# Patient Record
Sex: Female | Born: 1980 | Race: White | Hispanic: No | Marital: Married
Health system: Midwestern US, Community
[De-identification: ages and names within clinical notes are randomized; demographics above are authoritative.]

## PROBLEM LIST (undated history)

## (undated) DIAGNOSIS — F419 Anxiety disorder, unspecified: Secondary | ICD-10-CM

## (undated) DIAGNOSIS — N946 Dysmenorrhea, unspecified: Secondary | ICD-10-CM

## (undated) DIAGNOSIS — T7840XA Allergy, unspecified, initial encounter: Secondary | ICD-10-CM

## (undated) DIAGNOSIS — D509 Iron deficiency anemia, unspecified: Secondary | ICD-10-CM

## (undated) DIAGNOSIS — Z0184 Encounter for antibody response examination: Secondary | ICD-10-CM

## (undated) DIAGNOSIS — R768 Other specified abnormal immunological findings in serum: Secondary | ICD-10-CM

## (undated) DIAGNOSIS — R0602 Shortness of breath: Principal | ICD-10-CM

## (undated) DIAGNOSIS — Z0000 Encounter for general adult medical examination without abnormal findings: Secondary | ICD-10-CM

## (undated) DIAGNOSIS — Z1211 Encounter for screening for malignant neoplasm of colon: Principal | ICD-10-CM

## (undated) HISTORY — PX: TONSILECTOMY, ADENOIDECTOMY, BILATERAL MYRINGOTOMY AND TUBES: SHX2538

## (undated) HISTORY — DX: Anxiety disorder, unspecified: F41.9

## (undated) HISTORY — DX: Allergy, unspecified, initial encounter: T78.40XA

## (undated) HISTORY — DX: Dysmenorrhea, unspecified: N94.6

## (undated) HISTORY — PX: REFRACTIVE SURGERY: SHX103

## (undated) HISTORY — PX: EYE SURGERY: SHX253

---

## 2004-02-14 ENCOUNTER — Other Ambulatory Visit: Admission: RE | Admit: 2004-02-14 | Discharge: 2004-02-14 | Payer: Self-pay | Admitting: Gynecology

## 2005-02-19 ENCOUNTER — Other Ambulatory Visit: Admission: RE | Admit: 2005-02-19 | Discharge: 2005-02-19 | Payer: Self-pay | Admitting: Gynecology

## 2005-04-01 ENCOUNTER — Ambulatory Visit: Payer: Self-pay | Admitting: Family Medicine

## 2005-06-10 ENCOUNTER — Ambulatory Visit: Payer: Self-pay | Admitting: Family Medicine

## 2006-02-24 ENCOUNTER — Other Ambulatory Visit: Admission: RE | Admit: 2006-02-24 | Discharge: 2006-02-24 | Payer: Self-pay | Admitting: Gynecology

## 2006-05-26 LAB — HM SIGMOIDOSCOPY

## 2006-07-11 ENCOUNTER — Emergency Department (HOSPITAL_COMMUNITY): Admission: EM | Admit: 2006-07-11 | Discharge: 2006-07-11 | Payer: Self-pay | Admitting: Emergency Medicine

## 2006-07-28 ENCOUNTER — Ambulatory Visit: Payer: Self-pay | Admitting: Family Medicine

## 2007-01-21 ENCOUNTER — Ambulatory Visit: Payer: Self-pay | Admitting: Family Medicine

## 2007-01-21 DIAGNOSIS — K219 Gastro-esophageal reflux disease without esophagitis: Secondary | ICD-10-CM | POA: Insufficient documentation

## 2007-01-21 DIAGNOSIS — Z8669 Personal history of other diseases of the nervous system and sense organs: Secondary | ICD-10-CM

## 2007-01-21 DIAGNOSIS — J309 Allergic rhinitis, unspecified: Secondary | ICD-10-CM | POA: Insufficient documentation

## 2007-04-07 ENCOUNTER — Other Ambulatory Visit: Admission: RE | Admit: 2007-04-07 | Discharge: 2007-04-07 | Payer: Self-pay | Admitting: Gynecology

## 2007-05-05 ENCOUNTER — Ambulatory Visit: Payer: Self-pay | Admitting: Family Medicine

## 2007-05-05 DIAGNOSIS — L219 Seborrheic dermatitis, unspecified: Secondary | ICD-10-CM

## 2007-07-07 ENCOUNTER — Ambulatory Visit: Payer: Self-pay | Admitting: Family Medicine

## 2007-07-07 DIAGNOSIS — J019 Acute sinusitis, unspecified: Secondary | ICD-10-CM

## 2007-07-14 ENCOUNTER — Telehealth: Payer: Self-pay | Admitting: Family Medicine

## 2007-07-22 ENCOUNTER — Encounter: Payer: Self-pay | Admitting: Family Medicine

## 2007-09-10 ENCOUNTER — Encounter: Payer: Self-pay | Admitting: Gastroenterology

## 2007-09-10 ENCOUNTER — Ambulatory Visit: Payer: Self-pay | Admitting: Gastroenterology

## 2007-09-10 DIAGNOSIS — K625 Hemorrhage of anus and rectum: Secondary | ICD-10-CM

## 2007-09-10 DIAGNOSIS — K602 Anal fissure, unspecified: Secondary | ICD-10-CM

## 2007-10-19 ENCOUNTER — Telehealth: Payer: Self-pay | Admitting: Gastroenterology

## 2007-10-20 ENCOUNTER — Ambulatory Visit: Payer: Self-pay | Admitting: Gastroenterology

## 2008-01-05 ENCOUNTER — Emergency Department (HOSPITAL_COMMUNITY): Admission: EM | Admit: 2008-01-05 | Discharge: 2008-01-05 | Payer: Self-pay | Admitting: Emergency Medicine

## 2008-04-07 ENCOUNTER — Encounter: Payer: Self-pay | Admitting: Women's Health

## 2008-04-07 ENCOUNTER — Other Ambulatory Visit: Admission: RE | Admit: 2008-04-07 | Discharge: 2008-04-07 | Payer: Self-pay | Admitting: Gynecology

## 2008-04-07 ENCOUNTER — Ambulatory Visit: Payer: Self-pay | Admitting: Women's Health

## 2008-08-22 ENCOUNTER — Telehealth: Payer: Self-pay | Admitting: Family Medicine

## 2008-10-02 ENCOUNTER — Ambulatory Visit: Payer: Self-pay | Admitting: Family Medicine

## 2008-10-02 DIAGNOSIS — K5900 Constipation, unspecified: Secondary | ICD-10-CM | POA: Insufficient documentation

## 2008-10-02 LAB — CONVERTED CEMR LAB
Bilirubin Urine: NEGATIVE
Blood in Urine, dipstick: NEGATIVE
Glucose, Urine, Semiquant: NEGATIVE
Protein, U semiquant: NEGATIVE
WBC Urine, dipstick: NEGATIVE
pH: 6

## 2008-10-04 LAB — CONVERTED CEMR LAB
ALT: 24 units/L (ref 0–35)
AST: 30 units/L (ref 0–37)
BUN: 9 mg/dL (ref 6–23)
Bilirubin, Direct: 0.1 mg/dL (ref 0.0–0.3)
Calcium: 9.4 mg/dL (ref 8.4–10.5)
Cholesterol: 211 mg/dL — ABNORMAL HIGH (ref 0–200)
Creatinine, Ser: 0.8 mg/dL (ref 0.4–1.2)
Direct LDL: 94.1 mg/dL
Eosinophils Relative: 2.1 % (ref 0.0–5.0)
GFR calc non Af Amer: 91.18 mL/min (ref >60)
HDL: 95.5 mg/dL (ref >39.00)
Monocytes Relative: 6.9 % (ref 3.0–12.0)
Neutrophils Relative %: 61.6 % (ref 43.0–77.0)
Platelets: 254 10*3/uL (ref 150.0–400.0)
Total Bilirubin: 0.7 mg/dL (ref 0.3–1.2)
Triglycerides: 125 mg/dL (ref 0.0–149.0)
VLDL: 25 mg/dL (ref 0.0–40.0)
WBC: 7.6 10*3/uL (ref 4.5–10.5)

## 2008-10-24 ENCOUNTER — Ambulatory Visit: Payer: Self-pay | Admitting: Family Medicine

## 2008-10-24 DIAGNOSIS — R599 Enlarged lymph nodes, unspecified: Secondary | ICD-10-CM | POA: Insufficient documentation

## 2008-11-24 ENCOUNTER — Encounter: Payer: Self-pay | Admitting: Family Medicine

## 2009-02-19 ENCOUNTER — Encounter: Payer: Self-pay | Admitting: Family Medicine

## 2009-03-20 ENCOUNTER — Ambulatory Visit: Payer: Self-pay | Admitting: Family Medicine

## 2009-04-11 ENCOUNTER — Other Ambulatory Visit: Admission: RE | Admit: 2009-04-11 | Discharge: 2009-04-11 | Payer: Self-pay | Admitting: Gynecology

## 2009-04-11 ENCOUNTER — Ambulatory Visit: Payer: Self-pay | Admitting: Women's Health

## 2009-04-23 ENCOUNTER — Encounter (INDEPENDENT_AMBULATORY_CARE_PROVIDER_SITE_OTHER): Payer: Self-pay | Admitting: *Deleted

## 2010-01-15 ENCOUNTER — Ambulatory Visit: Payer: Self-pay | Admitting: Family Medicine

## 2010-01-15 LAB — CONVERTED CEMR LAB
Bilirubin Urine: NEGATIVE
Blood in Urine, dipstick: NEGATIVE
Ketones, urine, test strip: NEGATIVE
Nitrite: NEGATIVE
Specific Gravity, Urine: 1.02
Urobilinogen, UA: 0.2

## 2010-01-16 LAB — CONVERTED CEMR LAB
AST: 27 units/L (ref 0–37)
Alkaline Phosphatase: 43 units/L (ref 39–117)
BUN: 10 mg/dL (ref 6–23)
Basophils Absolute: 0 10*3/uL (ref 0.0–0.1)
Bilirubin, Direct: 0.1 mg/dL (ref 0.0–0.3)
Calcium: 8.8 mg/dL (ref 8.4–10.5)
Cholesterol: 205 mg/dL — ABNORMAL HIGH (ref 0–200)
Creatinine, Ser: 0.8 mg/dL (ref 0.4–1.2)
Eosinophils Absolute: 0.2 10*3/uL (ref 0.0–0.7)
GFR calc non Af Amer: 87.8 mL/min (ref >60)
Glucose, Bld: 79 mg/dL (ref 70–99)
HDL: 97.1 mg/dL (ref >39.00)
Hemoglobin: 11.7 g/dL — ABNORMAL LOW (ref 12.0–15.0)
Lymphocytes Relative: 38.1 % (ref 12.0–46.0)
MCHC: 33.9 g/dL (ref 30.0–36.0)
Monocytes Relative: 6.8 % (ref 3.0–12.0)
Neutrophils Relative %: 52.5 % (ref 43.0–77.0)
Platelets: 267 10*3/uL (ref 150.0–400.0)
RDW: 13.6 % (ref 11.5–14.6)
Sodium: 140 meq/L (ref 135–145)
Total Bilirubin: 0.5 mg/dL (ref 0.3–1.2)
Triglycerides: 138 mg/dL (ref 0.0–149.0)

## 2010-01-22 ENCOUNTER — Ambulatory Visit: Payer: Self-pay | Admitting: Family Medicine

## 2010-04-12 ENCOUNTER — Ambulatory Visit: Payer: Self-pay | Admitting: Women's Health

## 2010-04-12 ENCOUNTER — Other Ambulatory Visit: Admission: RE | Admit: 2010-04-12 | Discharge: 2010-04-12 | Payer: Self-pay | Admitting: Gynecology

## 2010-06-26 NOTE — Assessment & Plan Note (Signed)
Summary: cpx--no pap//ccm   Vital Signs:  Patient profile:   30 year old female Height:      65 inches Weight:      161 pounds BMI:     26.89 O2 Sat:      98 % Temp:     98.4 degrees F Pulse rate:   97 / minute BP sitting:   104 / 70  (left arm)  Vitals Entered By: Pura Spice, RN (January 22, 2010 2:04 PM) CC: CPX sees Maryelizabeth Rowan ,NP at [facility]  Last pap 2010 Is Patient Diabetic? No   History of Present Illness: 30 yr old female for a cpx. She feels fine and only has one concern. For the past year she has had trouble hearing people speak or following conversations in situations where there is a lot of background noise. No ear discomfort and no tinnitus.   Preventive Screening-Counseling & Management  Alcohol-Tobacco     Smoking Status: never  Allergies (verified): No Known Drug Allergies  Past History:  Past Medical History: Reviewed history from 03/20/2009 and no changes required. Allergic rhinitis rectal pain, has seen Dr. Virginia Rochester sees Maryelizabeth Rowan for GYN exams travel anxiety IBS  Past Surgical History: Tonsillectomy flexible sigmoidoscopy per Dr. Virginia Rochester 2008, clear except internal hemorrhoids LASIK  Family History: Reviewed history from 09/10/2007 and no changes required. No FH of Colon Cancer:  Social History: Reviewed history from 10/02/2008 and no changes required. Occupation: self employed, Sales executive Patient has never smoked.  Alcohol Use - yes Married  Review of Systems  The patient denies anorexia, fever, weight loss, weight gain, vision loss, hoarseness, chest pain, syncope, dyspnea on exertion, peripheral edema, prolonged cough, headaches, hemoptysis, abdominal pain, melena, hematochezia, severe indigestion/heartburn, hematuria, incontinence, genital sores, muscle weakness, suspicious skin lesions, transient blindness, difficulty walking, depression, unusual weight change, abnormal bleeding, enlarged lymph nodes, angioedema, breast  masses, and testicular masses.    Physical Exam  General:  Well-developed,well-nourished,in no acute distress; alert,appropriate and cooperative throughout examination Head:  Normocephalic and atraumatic without obvious abnormalities. No apparent alopecia or balding. Eyes:  No corneal or conjunctival inflammation noted. EOMI. Perrla. Funduscopic exam benign, without hemorrhages, exudates or papilledema. Vision grossly normal. Ears:  External ear exam shows no significant lesions or deformities.  Otoscopic examination reveals clear canals, tympanic membranes are intact bilaterally without bulging, retraction, inflammation or discharge. Hearing is grossly normal bilaterally. Nose:  External nasal examination shows no deformity or inflammation. Nasal mucosa are pink and moist without lesions or exudates. Mouth:  Oral mucosa and oropharynx without lesions or exudates.  Teeth in good repair. Neck:  No deformities, masses, or tenderness noted. Chest Wall:  No deformities, masses, or tenderness noted. Lungs:  Normal respiratory effort, chest expands symmetrically. Lungs are clear to auscultation, no crackles or wheezes. Heart:  Normal rate and regular rhythm. S1 and S2 normal without gallop, murmur, click, rub or other extra sounds. Abdomen:  Bowel sounds positive,abdomen soft and non-tender without masses, organomegaly or hernias noted. Msk:  No deformity or scoliosis noted of thoracic or lumbar spine.   Pulses:  R and L carotid,radial,femoral,dorsalis pedis and posterior tibial pulses are full and equal bilaterally Extremities:  No clubbing, cyanosis, edema, or deformity noted with normal full range of motion of all joints.   Neurologic:  No cranial nerve deficits noted. Station and gait are normal. Plantar reflexes are down-going bilaterally. DTRs are symmetrical throughout. Sensory, motor and coordinative functions appear intact. Skin:  Intact without suspicious  lesions or rashes Cervical Nodes:  No  lymphadenopathy noted Axillary Nodes:  No palpable lymphadenopathy Inguinal Nodes:  No significant adenopathy Psych:  Cognition and judgment appear intact. Alert and cooperative with normal attention span and concentration. No apparent delusions, illusions, hallucinations   Impression & Recommendations:  Problem # 1:  WELL ADULT EXAM (ICD-V70.0)  Complete Medication List: 1)  Allegra 180 Mg Tabs (Fexofenadine hcl) .... Once daily 2)  Mircette 0.15-0.02/0.01 Mg (21/5) Tabs (Desogestrel-ethinyl estradiol) .Marland Kitchen.. 1 by mouth once daily 3)  Ativan 1 Mg Tabs (Lorazepam) .Marland Kitchen.. 1 every 6 hours as needed anxiety  Patient Instructions: 1)  It is important that you exercise reguarly at least 20 minutes 5 times a week. If you develop chest pain, have severe difficulty breathing, or feel very tired, stop exercising immediately and seek medical attention.  2)  will have her see an Audiologist for testing

## 2010-07-15 ENCOUNTER — Telehealth: Payer: Self-pay | Admitting: Family Medicine

## 2010-07-15 NOTE — Telephone Encounter (Signed)
Pt needs note from Dr. Clent Ridges stating that she needs to have dog at apartment due to medical reasons.  Please fax note to [phone] attn: Nadine Counts

## 2010-07-18 ENCOUNTER — Telehealth: Payer: Self-pay | Admitting: Family Medicine

## 2010-07-18 ENCOUNTER — Telehealth: Payer: Self-pay

## 2010-07-18 NOTE — Telephone Encounter (Signed)
Left mess on her cell phone note could not be written by dr fry .

## 2010-07-18 NOTE — Telephone Encounter (Signed)
No I cannot write such a note. I don't even treat her for anxiety.

## 2010-07-18 NOTE — Telephone Encounter (Signed)
Note must state due to anxiety issues also fyi the dog size is over condo limit.

## 2010-07-18 NOTE — Telephone Encounter (Signed)
Pt called again regarding note and states Dr Clent Ridges does treat her anxiety with lorazepam. Informed pt looks like he tx her for travel anxiety and she stated she take medication more than just traveling. Stated she goes to [employer] and sees Dr Leonor Liv psychologist for panic attacks and would like the note.   Pt advised she could call Dr Leonor Liv and see if he would do a note. Informed her Dr Clent Ridges not in this evening but would advise him of call.

## 2010-07-18 NOTE — Telephone Encounter (Signed)
Error/njr °

## 2010-07-19 NOTE — Telephone Encounter (Signed)
Left mess on cell phone stating Dr Clent Ridges cannot write the note.

## 2010-07-19 NOTE — Telephone Encounter (Signed)
NO I cannot do this. I have never treated her for anxiety so severe that she needs to keep a dog in her apartment. This is ridiculous!

## 2010-09-25 ENCOUNTER — Encounter: Payer: Self-pay | Admitting: Family Medicine

## 2010-09-25 ENCOUNTER — Ambulatory Visit (INDEPENDENT_AMBULATORY_CARE_PROVIDER_SITE_OTHER): Payer: BC Managed Care – PPO | Admitting: Family Medicine

## 2010-09-25 VITALS — BP 112/72 | Temp 98.3°F | Ht 65.0 in | Wt 163.0 lb

## 2010-09-25 DIAGNOSIS — R42 Dizziness and giddiness: Secondary | ICD-10-CM

## 2010-09-25 MED ORDER — FUROSEMIDE 20 MG PO TABS: 20.0000 mg | ORAL_TABLET | Freq: Every day | ORAL | Status: DC

## 2010-09-25 NOTE — Progress Notes (Signed)
  Subjective:    Patient ID: [name] [name], female    DOB: [birth year], 30 y.o.   MRN: [id number]  HPI Here for vertigo symptoms that have bothered her off and on for about 10 years. They are becoming more frequent, and now she has had daily problems with these for the past 6 months. She describes dizziness that makes it feel like the room is spinning. She sometimes gets nauseated with them but not always. She has occasional ringing in the ears, but not often. No change in hearing, no HAs. No other neurologic deficits. These spells are usually brought on by moving her head suddenly, and they last about 30 seconds at a time. She has been taking Allegra daily for the past 2 months for her typical allergies, but this has not affected the vertigo.    Review of Systems  Constitutional: Negative.   HENT: Negative.   Neurological: Positive for dizziness. Negative for tremors, seizures, facial asymmetry, speech difficulty, weakness, numbness and headaches.       Objective:   Physical Exam  Constitutional: She is oriented to person, place, and time. She appears well-developed and well-nourished.  HENT:  Head: Normocephalic and atraumatic.  Right Ear: External ear normal.  Left Ear: External ear normal.  Nose: Nose normal.  Mouth/Throat: Oropharynx is clear and moist. No oropharyngeal exudate.  Eyes: Conjunctivae are normal. Pupils are equal, round, and reactive to light.  Neck: Normal range of motion. Neck supple. No thyromegaly present.  Cardiovascular: Normal rate, regular rhythm, normal heart sounds and intact distal pulses.   Lymphadenopathy:    She has no cervical adenopathy.  Neurological: She is alert and oriented to person, place, and time. No cranial nerve deficit. She exhibits normal muscle tone. Coordination normal.          Assessment & [name]:  This is classic vertigo. Try a diuretic to see if this helps. Set up a brain MRI to rule out structural problems. If this does not help,  we will refer her to Vestibular Rehab.

## 2010-09-28 ENCOUNTER — Telehealth: Payer: Self-pay | Admitting: Family Medicine

## 2010-09-28 DIAGNOSIS — R42 Dizziness and giddiness: Secondary | ICD-10-CM

## 2010-09-28 NOTE — Telephone Encounter (Signed)
We will cancel the MRI order and send her to ENT instead

## 2011-02-21 LAB — POCT I-STAT, CHEM 8
BUN: 10
Calcium, Ion: 1.2
Creatinine, Ser: 1.1
Hemoglobin: 12.9
Sodium: 138
TCO2: 24

## 2011-02-21 LAB — CBC
Hemoglobin: 12.2
MCHC: 34
Platelets: 272
RDW: 15.4

## 2011-02-21 LAB — DIFFERENTIAL
Basophils Absolute: 0
Basophils Relative: 0
Eosinophils Relative: 3
Lymphocytes Relative: 38
Monocytes Absolute: 0.6
Neutro Abs: 3.7

## 2011-02-21 LAB — URINALYSIS, ROUTINE W REFLEX MICROSCOPIC
Glucose, UA: NEGATIVE
Hgb urine dipstick: NEGATIVE
Ketones, ur: NEGATIVE
Protein, ur: NEGATIVE
Urobilinogen, UA: 0.2

## 2011-02-21 LAB — POCT PREGNANCY, URINE: Preg Test, Ur: NEGATIVE

## 2011-02-21 LAB — WET PREP, GENITAL
Clue Cells Wet Prep HPF POC: NONE SEEN
Yeast Wet Prep HPF POC: NONE SEEN

## 2011-02-21 LAB — GC/CHLAMYDIA PROBE AMP, GENITAL: GC Probe Amp, Genital: NEGATIVE

## 2011-02-21 LAB — RPR: RPR Ser Ql: NONREACTIVE

## 2011-03-28 ENCOUNTER — Other Ambulatory Visit: Payer: Self-pay | Admitting: Women's Health

## 2011-04-09 DIAGNOSIS — N946 Dysmenorrhea, unspecified: Secondary | ICD-10-CM | POA: Insufficient documentation

## 2011-04-14 ENCOUNTER — Other Ambulatory Visit (HOSPITAL_COMMUNITY)
Admission: RE | Admit: 2011-04-14 | Discharge: 2011-04-14 | Disposition: A | Discharge status: Home / Self Care | Payer: BC Managed Care – PPO | Source: Ambulatory Visit | Attending: Obstetrics and Gynecology | Admitting: Obstetrics and Gynecology

## 2011-04-14 ENCOUNTER — Encounter: Payer: Self-pay | Admitting: Women's Health

## 2011-04-14 ENCOUNTER — Ambulatory Visit (INDEPENDENT_AMBULATORY_CARE_PROVIDER_SITE_OTHER): Payer: BC Managed Care – PPO | Admitting: Women's Health

## 2011-04-14 VITALS — BP 110/70 | Ht 65.5 in | Wt 158.0 lb

## 2011-04-14 DIAGNOSIS — Z01419 Encounter for gynecological examination (general) (routine) without abnormal findings: Secondary | ICD-10-CM | POA: Insufficient documentation

## 2011-04-14 DIAGNOSIS — IMO0001 Reserved for inherently not codable concepts without codable children: Secondary | ICD-10-CM

## 2011-04-14 DIAGNOSIS — Z309 Encounter for contraceptive management, unspecified: Secondary | ICD-10-CM

## 2011-04-14 MED ORDER — DESOGESTREL-ETHINYL ESTRADIOL 0.15-0.02/0.01 MG (21/5) PO TABS: 1.0000 | ORAL_TABLET | Freq: Every day | ORAL | Status: DC

## 2011-04-14 NOTE — Progress Notes (Signed)
[name] [name] Mar 24, 1981 [id number]    History:    The patient presents for annual exam.  Works in [employer].  Past medical history, past surgical history, family history and social history were all reviewed and documented in the EPIC chart.   ROS:  A  ROS was performed and pertinent positives and negatives are included in the history.  Exam:  Filed Vitals:   04/14/11 1048  BP: 110/70    General appearance:  Normal Head/Neck:  Normal, without cervical or supraclavicular adenopathy. Thyroid:  Symmetrical, normal in size, without palpable masses or nodularity. Respiratory  Effort:  Normal  Auscultation:  Clear without wheezing or rhonchi Cardiovascular  Auscultation:  Regular rate, without rubs, murmurs or gallops  Edema/varicosities:  Not grossly evident Abdominal  Soft,nontender, without masses, guarding or rebound.  Liver/spleen:  No organomegaly noted  Hernia:  None appreciated  Skin  Inspection:  Grossly normal  Palpation:  Grossly normal Neurologic/psychiatric  Orientation:  Normal with appropriate conversation.  Mood/affect:  Normal  Genitourinary    Breasts: Examined lying and sitting.     Right: Without masses, retractions, discharge or axillary adenopathy.     Left: Without masses, retractions, discharge or axillary adenopathy.   Inguinal/mons:  Normal without inguinal adenopathy  External genitalia:  Normal  BUS/Urethra/Skene's glands:  Normal  Bladder:  Normal  Vagina:  Normal  Cervix:  Normal  Uterus:   normal in size, shape and contour.  Midline and mobile  Adnexa/parametria:     Rt: Without masses or tenderness.   Lt: Without masses or tenderness.  Anus and perineum: Normal  Digital rectal exam: Normal sphincter tone without palpated masses or tenderness  Assessment/Plan:  30 y.o. [name] [name] G0 for annual exam. Monthly 3 day cycle on Kariva. Statesl has some breast tenderness with cycle some months. Long-term history of vertigo, primary care manages.  History of normal Paps, did not receive Gardasil. CF screen negative and rubella immune.  Normal GYN exam Vertigo/inner ear issue  Plan: Kariva prescription, proper use, slight risk for blood clots and strokes was reviewed. Reviewed breast tenderness is common with cycles, encouraged to continue SBEs and report changes. No family history of breast cancer. Encouraged to continue exercise, MVI daily, calcium rich diet. Pap only, had normal labs at primary care.   [name] [name] [name] [name], 12:46 PM 04/14/2011

## 2011-09-18 ENCOUNTER — Ambulatory Visit (INDEPENDENT_AMBULATORY_CARE_PROVIDER_SITE_OTHER): Payer: BC Managed Care – PPO | Admitting: Women's Health

## 2011-09-18 ENCOUNTER — Encounter: Payer: Self-pay | Admitting: Women's Health

## 2011-09-18 DIAGNOSIS — Z309 Encounter for contraceptive management, unspecified: Secondary | ICD-10-CM
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
## 2011-09-18 NOTE — Progress Notes (Signed)
Patient ID: [name] [name], female   DOB: April 03, 1981, 31 y.o.   MRN: [id number] Presents to discuss pregnancy. Currently on Kariva without a problem. History of rubella positive, negative CF screen November of 2009. History of vertigo in the past none recent. Husband in good health. Currently taking a vitamin daily, folic acid and vitamin D. Will start on a prenatal vitamin daily. Reviewed safe pregnancy behaviors, no alcohol or unpasteurized cheeses. Does not have a cat. Will continue healthy lifestyle. Ovulation predictors, timing of pregnancy reviewed. Instructed to return to the office for a viability ultrasound around 6-7 weeks. Questions answered. Aware we no longer deliver. Takes Lasix 20 mg 2-3 times per week to prevent vertigo, reviewed category C. drug will cut down and stop when she is pregnant. Currently on Allegra reviewed category C. drug will take plain Zyrtec for seasonal allergies. Encouraged Tdap vaccine for both her and her husband.

## 2011-10-07 ENCOUNTER — Ambulatory Visit (INDEPENDENT_AMBULATORY_CARE_PROVIDER_SITE_OTHER): Payer: BC Managed Care – PPO | Admitting: Family Medicine

## 2011-10-07 ENCOUNTER — Encounter: Payer: Self-pay | Admitting: Family Medicine

## 2011-10-07 VITALS — BP 110/80 | HR 84 | Temp 98.7°F | Wt 159.0 lb

## 2011-10-07 DIAGNOSIS — L989 Disorder of the skin and subcutaneous tissue, unspecified: Secondary | ICD-10-CM

## 2011-10-07 NOTE — Progress Notes (Signed)
  Subjective:    Patient ID: [name] [name], female    DOB: Jun 14, 1980, 31 y.o.   MRN: [id number]  HPI Here to check several skin lesions she is worried about. Most of these are not changing at all, but the one on her left 2nd toe came up suddenly 2 months ago. It has been growing steadily since then. No color changes. She has had a few benign moles removed form her back many years ago.    Review of Systems  Constitutional: Negative.        Objective:   Physical Exam  Constitutional: She appears well-developed and well-nourished.  Skin:       There is a macular light brown nevus on the tip of the left 2nd toe. There is an intradermal blue nevus on the left forearm. She has several benign appearing light brown or dark brown nevi over the upper back           Assessment & [name]:  She has numerous skin lesions that appear to be benign, however the one on her toe is growing rapidly, so this should be looked at closely. We will refer to Dermatology.

## 2011-10-25 ENCOUNTER — Other Ambulatory Visit: Payer: Self-pay | Admitting: Family Medicine

## 2012-04-14 ENCOUNTER — Encounter: Payer: BC Managed Care – PPO | Admitting: Women's Health

## 2012-04-16 ENCOUNTER — Encounter: Payer: Self-pay | Admitting: Women's Health

## 2012-04-16 ENCOUNTER — Ambulatory Visit (INDEPENDENT_AMBULATORY_CARE_PROVIDER_SITE_OTHER): Payer: BC Managed Care – PPO | Admitting: Women's Health

## 2012-04-16 VITALS — BP 122/80 | Ht 65.0 in | Wt 162.0 lb

## 2012-04-16 DIAGNOSIS — N926 Irregular menstruation, unspecified: Secondary | ICD-10-CM

## 2012-04-16 DIAGNOSIS — Z01419 Encounter for gynecological examination (general) (routine) without abnormal findings: Secondary | ICD-10-CM

## 2012-04-16 LAB — CBC WITH DIFFERENTIAL/PLATELET
Basophils Absolute: 0 10*3/uL (ref 0.0–0.1)
Basophils Relative: 0 % (ref 0–1)
Eosinophils Absolute: 0.1 10*3/uL (ref 0.0–0.7)
Eosinophils Relative: 1 % (ref 0–5)
HCT: 40 % (ref 36.0–46.0)
Lymphocytes Relative: 37 % (ref 12–46)
MCH: 30.2 pg (ref 26.0–34.0)
MCHC: 34.8 g/dL (ref 30.0–36.0)
MCV: 86.8 fL (ref 78.0–100.0)
Monocytes Absolute: 0.4 10*3/uL (ref 0.1–1.0)
Platelets: 336 10*3/uL (ref 150–400)
RDW: 13.8 % (ref 11.5–15.5)

## 2012-04-16 LAB — TSH: TSH: 1.312 u[IU]/mL (ref 0.350–4.500)

## 2012-04-16 NOTE — Patient Instructions (Addendum)

## 2012-04-16 NOTE — Progress Notes (Signed)
[name] [name] 29-Jul-1980 [id number]    History:    The patient presents for annual exam.  Stopped birth control pills in June and has not conceived. Having intercourse 2-3 times per week. Regular monthly 28-30 day cycle for 3-4 days. Negative CF screen and rubella positive.   Past medical history, past surgical history, family history and social history were all reviewed and documented in the EPIC chart. 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   ROS:  A  ROS was performed and pertinent positives and negatives are included in the history.  Exam:  Filed Vitals:   04/16/12 1055  BP: 122/80    General appearance:  Normal Head/Neck:  Normal, without cervical or supraclavicular adenopathy. Thyroid:  Symmetrical, normal in size, without palpable masses or nodularity. Respiratory  Effort:  Normal  Auscultation:  Clear without wheezing or rhonchi Cardiovascular  Auscultation:  Regular rate, without rubs, murmurs or gallops  Edema/varicosities:  Not grossly evident Abdominal  Soft,nontender, without masses, guarding or rebound.  Liver/spleen:  No organomegaly noted  Hernia:  None appreciated  Skin  Inspection:  Grossly normal  Palpation:  Grossly normal Neurologic/psychiatric  Orientation:  Normal with appropriate conversation.  Mood/affect:  Normal  Genitourinary    Breasts: Examined lying and sitting.     Right: Without masses, retractions, discharge or axillary adenopathy.     Left: Without masses, retractions, discharge or axillary adenopathy.   Inguinal/mons:  Normal without inguinal adenopathy  External genitalia:  Normal  BUS/Urethra/Skene's glands:  Normal  Bladder:  Normal  Vagina:  Normal  Cervix:  Normal  Uterus:   normal in size, shape and contour.  Midline and mobile  Adnexa/parametria:     Rt: Without masses or tenderness.   Lt: Without masses or tenderness.  Anus and perineum: Normal  Digital rectal exam: Normal sphincter tone without palpated  masses or tenderness  Assessment/Plan:  31 y.o. M. WF G0  for annual exam.     Normal GYN exam desiring conception  Plan: Reviewed importance of increasing frequency of intercourse, ovulation timing discussed, aware we no longer deliver, will schedule viability ultrasound with missed cycle. SBE's, continue exercise, calcium rich diet, PNV daily encouraged. Aware of healthy pregnancy behaviors. CBC, TSH, prolactin, UA, Pap normal 2012, new screening guidelines reviewed.    Harrington Challenger [facility], 11:38 AM 04/16/2012

## 2012-04-17 LAB — URINALYSIS W MICROSCOPIC + REFLEX CULTURE
Bacteria, UA: NONE SEEN
Bilirubin Urine: NEGATIVE
Crystals: NONE SEEN
Hgb urine dipstick: NEGATIVE
Ketones, ur: NEGATIVE mg/dL
Specific Gravity, Urine: 1.005 (ref 1.005–1.030)
Urobilinogen, UA: 0.2 mg/dL (ref 0.0–1.0)

## 2012-09-13 ENCOUNTER — Telehealth: Payer: Self-pay | Admitting: Family Medicine

## 2012-09-13 MED ORDER — LORAZEPAM 1 MG PO TABS: 1.0000 mg | ORAL_TABLET | Freq: Four times a day (QID) | ORAL | Status: DC | PRN

## 2012-09-13 NOTE — Telephone Encounter (Signed)
(#  1) Patient is calling in today requesting a refill for her Ativan.  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  Medication history shows- LORazepam (ATIVAN) 1 MG tablet Sig - Route: Take 1 mg by mouth every 6 (six) hours as needed. - Oral Class: Historical Med.   (#2)  Patient was also told by a friend that after an [employer] patients are now taking "Provigil" for alertness.  Is this something she can for a RX as well ??    Pharmacy- [facility]952 [phone] PATIENT  IS ASKING FOR A CALL BACK FOR APPROVAL OF MEDICATION.  FLIGHT IS TOMORROW. [phone]

## 2012-09-13 NOTE — Telephone Encounter (Signed)
I called in script and spoke with pt. 

## 2012-09-13 NOTE — Telephone Encounter (Signed)
Call in Ativan 1 mg to take q 6 hours prn anxiety, #60 with 2 rf. I do NOT prescribe Provigil for travel use.

## 2012-10-05 ENCOUNTER — Ambulatory Visit (INDEPENDENT_AMBULATORY_CARE_PROVIDER_SITE_OTHER): Payer: BC Managed Care – PPO | Admitting: Family Medicine

## 2012-10-05 ENCOUNTER — Encounter: Payer: Self-pay | Admitting: Family Medicine

## 2012-10-05 VITALS — BP 110/64 | HR 66 | Temp 98.3°F | Wt 161.0 lb

## 2012-10-05 DIAGNOSIS — K529 Noninfective gastroenteritis and colitis, unspecified: Secondary | ICD-10-CM

## 2012-10-05 DIAGNOSIS — K5289 Other specified noninfective gastroenteritis and colitis: Secondary | ICD-10-CM

## 2012-10-05 MED ORDER — PROMETHAZINE HCL 25 MG PO TABS: 25.0000 mg | ORAL_TABLET | ORAL | Status: DC | PRN

## 2012-10-05 MED ORDER — CIPROFLOXACIN HCL 500 MG PO TABS: 500.0000 mg | ORAL_TABLET | Freq: Two times a day (BID) | ORAL | Status: DC

## 2012-10-05 NOTE — Progress Notes (Signed)
  Subjective:    Patient ID: Marvel Plan, female    DOB: [birth year], 32 y.o.   MRN: [id number]  HPI Here with her husband for 8 days of nausea without vomiting and loose diarrhea. No fever or cramps. He has the exact same symptoms. They recently got home from a trip to [place]. Using Imodium prn.   Review of Systems  Constitutional: Positive for fatigue. Negative for fever, chills and diaphoresis.  Respiratory: Negative.   Gastrointestinal: Positive for nausea and diarrhea. Negative for vomiting, abdominal pain, constipation, blood in stool and abdominal distention.       Objective:   Physical Exam  Constitutional: She appears well-developed and well-nourished.  Abdominal: Soft. Bowel sounds are normal. She exhibits no distension and no mass. There is no tenderness. There is no rebound and no guarding.          Assessment & Plan:  Given Cipro. Use Imodium and Phenergan prn

## 2012-12-04 ENCOUNTER — Encounter (HOSPITAL_COMMUNITY): Payer: Self-pay | Admitting: Pharmacy Technician

## 2012-12-06 MED ORDER — DEXTROSE 5 % IV SOLN
2.0000 g | INTRAVENOUS | Status: AC
  Administered 2012-12-07: 2 g via INTRAVENOUS
  Filled 2012-12-06: qty 2

## 2012-12-06 NOTE — H&P (Addendum)
32 yo with twin missed ab presents for surgical mngt  PMHx: anxiety, GERd PSHx:  none All:  None Meds:  PNV FHx: n/c SHx:  Negative tobacco, etoh, ivdu  Af, vss Gen - NAD ABd - soft, NT CV - RRR Lungs - clear Ext - NT  [place] - twins - no CM, CRL c/w [redacted]wks gestation  A/P:  Missed ab, twins D&E R/b/a discussed, informed consent

## 2012-12-07 ENCOUNTER — Ambulatory Visit (HOSPITAL_COMMUNITY)
Admission: RE | Admit: 2012-12-07 | Discharge: 2012-12-07 | Disposition: A | Discharge status: Home / Self Care | Payer: BC Managed Care – PPO | Source: Ambulatory Visit | Attending: Obstetrics and Gynecology | Admitting: Obstetrics and Gynecology

## 2012-12-07 ENCOUNTER — Encounter (HOSPITAL_COMMUNITY): Payer: Self-pay | Admitting: Anesthesiology

## 2012-12-07 ENCOUNTER — Encounter (HOSPITAL_COMMUNITY)
Admission: RE | Disposition: A | Discharge status: Home / Self Care | Payer: Self-pay | Source: Ambulatory Visit | Attending: Obstetrics and Gynecology

## 2012-12-07 ENCOUNTER — Ambulatory Visit (HOSPITAL_COMMUNITY): Payer: BC Managed Care – PPO | Admitting: Anesthesiology

## 2012-12-07 ENCOUNTER — Encounter (HOSPITAL_COMMUNITY): Payer: Self-pay | Admitting: *Deleted

## 2012-12-07 DIAGNOSIS — K219 Gastro-esophageal reflux disease without esophagitis: Secondary | ICD-10-CM | POA: Insufficient documentation

## 2012-12-07 DIAGNOSIS — O021 Missed abortion: Secondary | ICD-10-CM | POA: Insufficient documentation

## 2012-12-07 DIAGNOSIS — F411 Generalized anxiety disorder: Secondary | ICD-10-CM | POA: Insufficient documentation

## 2012-12-07 HISTORY — PX: DILATION AND EVACUATION: SHX1459

## 2012-12-07 LAB — ABO/RH: ABO/RH(D): O POS

## 2012-12-07 LAB — CBC
Hemoglobin: 12 g/dL (ref 12.0–15.0)
MCH: 29.3 pg (ref 26.0–34.0)
MCHC: 34 g/dL (ref 30.0–36.0)
Platelets: 257 10*3/uL (ref 150–400)
RDW: 13.4 % (ref 11.5–15.5)

## 2012-12-07 SURGERY — DILATION AND EVACUATION, UTERUS
Anesthesia: Monitor Anesthesia Care | Site: Vagina | Wound class: Clean Contaminated

## 2012-12-07 MED ORDER — ONDANSETRON HCL 4 MG/2ML IJ SOLN
INTRAMUSCULAR | Status: DC | PRN
  Administered 2012-12-07: 4 mg via INTRAVENOUS

## 2012-12-07 MED ORDER — LIDOCAINE HCL (CARDIAC) 20 MG/ML IV SOLN
INTRAVENOUS | Status: DC | PRN
  Administered 2012-12-07: 20 mg via INTRAVENOUS

## 2012-12-07 MED ORDER — FENTANYL CITRATE 0.05 MG/ML IJ SOLN
INTRAMUSCULAR | Status: DC | PRN
  Administered 2012-12-07 (×2): 50 ug via INTRAVENOUS

## 2012-12-07 MED ORDER — FENTANYL CITRATE 0.05 MG/ML IJ SOLN: 25.0000 ug | INTRAMUSCULAR | Status: DC | PRN

## 2012-12-07 MED ORDER — MIDAZOLAM HCL 5 MG/5ML IJ SOLN
INTRAMUSCULAR | Status: DC | PRN
  Administered 2012-12-07: 2 mg via INTRAVENOUS

## 2012-12-07 MED ORDER — CHLOROPROCAINE HCL 1 % IJ SOLN
INTRAMUSCULAR | Status: AC
  Filled 2012-12-07: qty 30

## 2012-12-07 MED ORDER — IBUPROFEN 200 MG PO TABS: 600.0000 mg | ORAL_TABLET | Freq: Four times a day (QID) | ORAL | Status: DC | PRN

## 2012-12-07 MED ORDER — MIDAZOLAM HCL 2 MG/2ML IJ SOLN
INTRAMUSCULAR | Status: AC
  Filled 2012-12-07: qty 2

## 2012-12-07 MED ORDER — PROMETHAZINE HCL 25 MG/ML IJ SOLN: 6.2500 mg | INTRAMUSCULAR | Status: DC | PRN

## 2012-12-07 MED ORDER — ONDANSETRON HCL 4 MG/2ML IJ SOLN
INTRAMUSCULAR | Status: AC
  Filled 2012-12-07: qty 2

## 2012-12-07 MED ORDER — LACTATED RINGERS IV SOLN
INTRAVENOUS | Status: DC
  Administered 2012-12-07: 12:00:00 via INTRAVENOUS

## 2012-12-07 MED ORDER — MEPERIDINE HCL 25 MG/ML IJ SOLN: 6.2500 mg | INTRAMUSCULAR | Status: DC | PRN

## 2012-12-07 MED ORDER — FENTANYL CITRATE 0.05 MG/ML IJ SOLN
INTRAMUSCULAR | Status: AC
  Filled 2012-12-07: qty 4

## 2012-12-07 MED ORDER — PROPOFOL 10 MG/ML IV EMUL
INTRAVENOUS | Status: DC | PRN
  Administered 2012-12-07 (×2): 20 mg via INTRAVENOUS
  Administered 2012-12-07 (×2): 10 mg via INTRAVENOUS
  Administered 2012-12-07 (×4): 20 mg via INTRAVENOUS
  Administered 2012-12-07: 30 mg via INTRAVENOUS

## 2012-12-07 MED ORDER — KETOROLAC TROMETHAMINE 30 MG/ML IJ SOLN: 15.0000 mg | Freq: Once | INTRAMUSCULAR | Status: DC | PRN

## 2012-12-07 MED ORDER — METHYLERGONOVINE MALEATE 0.2 MG PO TABS: 0.2000 mg | ORAL_TABLET | Freq: Three times a day (TID) | ORAL | Status: DC

## 2012-12-07 MED ORDER — PROPOFOL 10 MG/ML IV EMUL
INTRAVENOUS | Status: AC
  Filled 2012-12-07: qty 20

## 2012-12-07 MED ORDER — KETOROLAC TROMETHAMINE 30 MG/ML IJ SOLN
INTRAMUSCULAR | Status: AC
  Filled 2012-12-07: qty 1

## 2012-12-07 MED ORDER — MIDAZOLAM HCL 2 MG/2ML IJ SOLN: 0.5000 mg | Freq: Once | INTRAMUSCULAR | Status: DC | PRN

## 2012-12-07 MED ORDER — CHLOROPROCAINE HCL 1 % IJ SOLN
INTRAMUSCULAR | Status: DC | PRN
  Administered 2012-12-07: 10 mL

## 2012-12-07 MED ORDER — ONDANSETRON HCL 4 MG/2ML IJ SOLN: 4.0000 mg | Freq: Once | INTRAMUSCULAR | Status: AC

## 2012-12-07 MED ORDER — LIDOCAINE HCL (CARDIAC) 20 MG/ML IV SOLN
INTRAVENOUS | Status: AC
  Filled 2012-12-07: qty 5

## 2012-12-07 MED ORDER — KETOROLAC TROMETHAMINE 30 MG/ML IJ SOLN
INTRAMUSCULAR | Status: DC | PRN
  Administered 2012-12-07: 30 mg via INTRAVENOUS

## 2012-12-07 MED ORDER — ONDANSETRON HCL 4 MG/2ML IJ SOLN
INTRAMUSCULAR | Status: AC
  Administered 2012-12-07: 4 mg via INTRAVENOUS
  Filled 2012-12-07: qty 2

## 2012-12-07 MED ORDER — OXYCODONE-ACETAMINOPHEN 5-325 MG PO TABS: 2.0000 | ORAL_TABLET | ORAL | Status: DC | PRN

## 2012-12-07 SURGICAL SUPPLY — 21 items
CATH ROBINSON RED A/P 16FR (CATHETERS) ×2 IMPLANT
CLOTH BEACON ORANGE TIMEOUT ST (SAFETY) ×2 IMPLANT
DECANTER SPIKE VIAL GLASS SM (MISCELLANEOUS) ×2 IMPLANT
GLOVE BIO SURGEON STRL SZ 6.5 (GLOVE) ×2 IMPLANT
GLOVE BIOGEL PI IND STRL 7.0 (GLOVE) ×1 IMPLANT
GLOVE BIOGEL PI INDICATOR 7.0 (GLOVE) ×1
GOWN STRL REIN XL XLG (GOWN DISPOSABLE) ×4 IMPLANT
KIT BERKELEY 1ST TRIMESTER 3/8 (MISCELLANEOUS) ×2 IMPLANT
NDL SPNL 22GX3.5 QUINCKE BK (NEEDLE) ×1 IMPLANT
NEEDLE SPNL 22GX3.5 QUINCKE BK (NEEDLE) ×2 IMPLANT
NS IRRIG 1000ML POUR BTL (IV SOLUTION) ×2 IMPLANT
PACK VAGINAL MINOR WOMEN LF (CUSTOM PROCEDURE TRAY) ×2 IMPLANT
PAD OB MATERNITY 4.3X12.25 (PERSONAL CARE ITEMS) ×2 IMPLANT
PAD PREP 24X48 CUFFED NSTRL (MISCELLANEOUS) ×2 IMPLANT
SET BERKELEY SUCTION TUBING (SUCTIONS) ×2 IMPLANT
SYR CONTROL 10ML LL (SYRINGE) ×2 IMPLANT
TOWEL OR 17X24 6PK STRL BLUE (TOWEL DISPOSABLE) ×4 IMPLANT
VACURETTE 10 RIGID CVD (CANNULA) IMPLANT
VACURETTE 7MM CVD STRL WRAP (CANNULA) IMPLANT
VACURETTE 8 RIGID CVD (CANNULA) ×1 IMPLANT
VACURETTE 9 RIGID CVD (CANNULA) IMPLANT

## 2012-12-07 NOTE — Preoperative (Addendum)
Beta Blockers   Reason not to administer Beta Blockers:Not Applicable 

## 2012-12-07 NOTE — Anesthesia Preprocedure Evaluation (Addendum)
Anesthesia Evaluation  Patient identified by MRN, date of birth, ID band Patient awake    Reviewed: Allergy & Precautions, H&P , Patient's Chart, lab work & pertinent test results, reviewed documented beta blocker date and time   History of Anesthesia Complications Negative for: history of anesthetic complications  Airway Mallampati: II TM Distance: >3 FB Neck ROM: full    Dental no notable dental hx.    Pulmonary neg pulmonary ROS,  breath sounds clear to auscultation  Pulmonary exam normal       Cardiovascular Exercise Tolerance: Good negative cardio ROS  Rhythm:regular Rate:Normal     Neuro/Psych negative neurological ROS  negative psych ROS   GI/Hepatic negative GI ROS, Neg liver ROS, GERD-  Controlled,  Endo/Other  negative endocrine ROS  Renal/GU negative Renal ROS     Musculoskeletal   Abdominal   Peds  Hematology negative hematology ROS (+)   Anesthesia Other Findings      Anxiety with travel    Dysmenorrhea          Reproductive/Obstetrics negative OB ROS                          Anesthesia Physical Anesthesia Plan  ASA: I  Anesthesia Plan: MAC   Post-op Pain Management:    Induction:   Airway Management Planned:   Additional Equipment:   Intra-op Plan:   Post-operative Plan:   Informed Consent: I have reviewed the patients History and Physical, chart, labs and discussed the procedure including the risks, benefits and alternatives for the proposed anesthesia with the patient or authorized representative who has indicated his/her understanding and acceptance.   Dental Advisory Given  Plan Discussed with: CRNA, Surgeon and Anesthesiologist  Anesthesia Plan Comments:        Anesthesia Quick Evaluation

## 2012-12-07 NOTE — Anesthesia Postprocedure Evaluation (Signed)
Anesthesia Post Note  Patient: [name] [name]  Procedure(s) Performed: Procedure(s) (LRB): DILATATION AND EVACUATION (N/A)  Anesthesia type: MAC  Patient location: PACU  Post pain: Pain level controlled  Post assessment: Post-op Vital signs reviewed  Last Vitals:  Filed Vitals:   12/07/12 1428  BP: 105/57  Pulse: 66  Temp: 36.8 C  Resp: 18    Post vital signs: Reviewed  Level of consciousness: sedated  Complications: No apparent anesthesia complications

## 2012-12-07 NOTE — Transfer of Care (Signed)
Immediate Anesthesia Transfer of Care Note  Patient: [name] [name]  Procedure(s) Performed: Procedure(s): DILATATION AND EVACUATION (N/A)  Patient Location: PACU  Anesthesia Type:Mac  Level of Consciousness: awake, alert  and oriented  Airway & Oxygen Therapy: Patient Spontanous Breathing  Post-op Assessment: Report given to PACU RN  Post vital signs: Reviewed and stable  Complications: No apparent anesthesia complications

## 2012-12-08 ENCOUNTER — Encounter (HOSPITAL_COMMUNITY): Payer: Self-pay | Admitting: Obstetrics and Gynecology

## 2012-12-08 NOTE — Op Note (Signed)
[name], [name] NO.:  [id number]  MEDICAL RECORD NO.:  [id number]  LOCATION:  [facility]                          FACILITY:  [facility]  PHYSICIAN:  Zelphia Cairo, MD    DATE OF BIRTH:  1981/03/08  DATE OF PROCEDURE: DATE OF DISCHARGE:  12/07/2012                              OPERATIVE REPORT   PREOPERATIVE DIAGNOSES: 1. Twin pregnancy. 2. Missed abortion.  POSTOPERATIVE DIAGNOSES: 1. Twin pregnancy. 2. Missed abortion.  PROCEDURES: 1. Cervical block. 2. Dilation and evacuation.  ANESTHESIA: 1. MAC. 2. Local.  SURGEON:  Zelphia Cairo, MD  COMPLICATIONS:  None.  CONDITION:  Stable to recovery room.  PROCEDURE:  The patient was taken to the operating room after informed consent was obtained.  She was given MAC anesthesia, placed in the dorsal lithotomy position using Allen stirrups.  She was prepped and draped in sterile fashion and an in-and-out catheter was used to drain her bladder for 50 mL of clear urine.  Time-out procedure was performed and a bivalve speculum was placed in the vagina.  1 mL of 1% Nesacaine was injected at 12 o'clock of the cervix and a single-tooth tenaculum was attached to the anterior lip of the cervix.  The remaining 9 mL of local anesthesia was used to perform a cervical block.  The cervix was then serially dilated using Pratt dilators and an 8-French suction catheter was inserted and products of conception were removed.  A gentle curetting was performed until the uterine cry was felt throughout. Suction catheter was reinserted to remove any remaining clots and debris.  Specimen was passed off to be sent to Pathology.  Tenaculum was removed from the cervix.  Pressure was applied to the anterior cervix using a ring forceps.  Once hemostasis was established, the ring forceps and the speculum were removed.  The patient was taken to the recovery room in stable condition.  Sponge, lap, needle, and instrument counts were correct  x2.     Zelphia Cairo, MD     GA/MEDQ  D:  12/07/2012  T:  12/08/2012  Job:  [id number]

## 2012-12-10 LAB — OB RESULTS CONSOLE RUBELLA ANTIBODY, IGM: RUBELLA: IMMUNE

## 2012-12-10 LAB — OB RESULTS CONSOLE ANTIBODY SCREEN: Antibody Screen: NEGATIVE

## 2012-12-10 LAB — OB RESULTS CONSOLE HIV ANTIBODY (ROUTINE TESTING): HIV: NONREACTIVE

## 2012-12-10 LAB — OB RESULTS CONSOLE HEPATITIS B SURFACE ANTIGEN: Hepatitis B Surface Ag: NEGATIVE

## 2012-12-20 ENCOUNTER — Other Ambulatory Visit: Payer: Self-pay | Admitting: Family Medicine

## 2013-01-25 ENCOUNTER — Ambulatory Visit (INDEPENDENT_AMBULATORY_CARE_PROVIDER_SITE_OTHER): Payer: BC Managed Care – PPO | Admitting: Family Medicine

## 2013-01-25 ENCOUNTER — Encounter: Payer: Self-pay | Admitting: Family Medicine

## 2013-01-25 VITALS — BP 102/60 | HR 99 | Temp 98.2°F | Wt 164.0 lb

## 2013-01-25 DIAGNOSIS — L659 Nonscarring hair loss, unspecified: Secondary | ICD-10-CM

## 2013-01-25 NOTE — Progress Notes (Signed)
  Subjective:    Patient ID: [name] [name], female    DOB: 02-Apr-1981, 32 y.o.   MRN: [id number]  HPI Here to discuss hair thinning. She has noticed this for several months with hair coming out on her brush and in the shower. Her hair stylist confirmed this at her last visit. She has had a rough summer, in that she found out she was pregnant with twins in June. However this pregnancy self terminated and she had a D and E procedure in July. She finally had a menses last week. She feels well in general. She takes a multivitamin daily.    Review of Systems  Constitutional: Negative.   Respiratory: Negative.   Cardiovascular: Negative.   Endocrine: Negative.   Neurological: Negative.        Objective:   Physical Exam  Constitutional: She appears well-developed and well-nourished.  Neck: No thyromegaly present.  Cardiovascular: Normal rate, regular rhythm, normal heart sounds and intact distal pulses.   Pulmonary/Chest: Effort normal and breath sounds normal.  Lymphadenopathy:    She has no cervical adenopathy.  Skin:  Her hair appears normal, the scalp is normal           Assessment & [name]:  Almost certainly this is due to the rapid hormonal changes her body has gone through this summer. I reassured her that this would probably reverse itself completely in the next few months as her body settles back down to her normal hormonal state. She will be back here in a few weeks for a cpx, so we will screen her with labs at that time for thyroid disorders, anemia, etc.

## 2013-02-04 ENCOUNTER — Other Ambulatory Visit: Payer: Self-pay | Admitting: Family Medicine

## 2013-02-15 ENCOUNTER — Other Ambulatory Visit (INDEPENDENT_AMBULATORY_CARE_PROVIDER_SITE_OTHER): Payer: BC Managed Care – PPO

## 2013-02-15 DIAGNOSIS — Z0000 Encounter for general adult medical examination without abnormal findings: Secondary | ICD-10-CM

## 2013-02-15 DIAGNOSIS — R7989 Other specified abnormal findings of blood chemistry: Secondary | ICD-10-CM

## 2013-02-15 LAB — BASIC METABOLIC PANEL
BUN: 12 mg/dL (ref 6–23)
Calcium: 9.1 mg/dL (ref 8.4–10.5)
GFR: 92.47 mL/min (ref >60.00)
Glucose, Bld: 84 mg/dL (ref 70–99)
Potassium: 4.3 mEq/L (ref 3.5–5.1)
Sodium: 140 mEq/L (ref 135–145)

## 2013-02-15 LAB — CBC WITH DIFFERENTIAL/PLATELET
Basophils Absolute: 0 10*3/uL (ref 0.0–0.1)
Eosinophils Relative: 1.4 % (ref 0.0–5.0)
HCT: 41 % (ref 36.0–46.0)
Hemoglobin: 13.7 g/dL (ref 12.0–15.0)
Lymphocytes Relative: 16.5 % (ref 12.0–46.0)
Lymphs Abs: 1.8 10*3/uL (ref 0.7–4.0)
Monocytes Relative: 5.2 % (ref 3.0–12.0)
Platelets: 288 10*3/uL (ref 150.0–400.0)
RDW: 13.7 % (ref 11.5–14.6)
WBC: 11 10*3/uL — ABNORMAL HIGH (ref 4.5–10.5)

## 2013-02-15 LAB — HEPATIC FUNCTION PANEL
ALT: 17 U/L (ref 0–35)
AST: 21 U/L (ref 0–37)
Alkaline Phosphatase: 53 U/L (ref 39–117)
Total Bilirubin: 0.9 mg/dL (ref 0.3–1.2)

## 2013-02-15 LAB — LIPID PANEL
HDL: 97.8 mg/dL (ref >39.00)
Total CHOL/HDL Ratio: 2
Triglycerides: 73 mg/dL (ref 0.0–149.0)
VLDL: 14.6 mg/dL (ref 0.0–40.0)

## 2013-02-15 LAB — LDL CHOLESTEROL, DIRECT: Direct LDL: 103.1 mg/dL

## 2013-02-15 LAB — POCT URINALYSIS DIPSTICK
Bilirubin, UA: NEGATIVE
Blood, UA: NEGATIVE
Glucose, UA: NEGATIVE
Ketones, UA: NEGATIVE
Spec Grav, UA: 1.02
pH, UA: 7.5

## 2013-02-18 NOTE — Progress Notes (Signed)
Quick Note:  Pt has appointment on 03/01/13 will go over then. ______

## 2013-03-01 ENCOUNTER — Encounter: Payer: Self-pay | Admitting: Family Medicine

## 2013-03-01 ENCOUNTER — Ambulatory Visit (INDEPENDENT_AMBULATORY_CARE_PROVIDER_SITE_OTHER): Payer: BC Managed Care – PPO | Admitting: Family Medicine

## 2013-03-01 VITALS — BP 100/58 | HR 93 | Temp 97.9°F | Ht 64.75 in | Wt 163.0 lb

## 2013-03-01 DIAGNOSIS — Z0000 Encounter for general adult medical examination without abnormal findings: Secondary | ICD-10-CM

## 2013-03-01 NOTE — Progress Notes (Signed)
  Subjective:    Patient ID: [name] [name], female    DOB: [birth year], 32 y.o.   MRN: [id number]  HPI 32 yr old female for a cpx. She feels fine. She was here recently for thinning hair, and we thought it may have been from hormonal changes while she got pregnant but then quickly had a miscarriage. Now this has stabilized and she thinks she is getting some regrowth.    Review of Systems  Constitutional: Negative.   HENT: Negative.   Eyes: Negative.   Respiratory: Negative.   Cardiovascular: Negative.   Gastrointestinal: Negative.   Genitourinary: Negative for dysuria, urgency, frequency, hematuria, flank pain, decreased urine volume, enuresis, difficulty urinating, pelvic pain and dyspareunia.  Musculoskeletal: Negative.   Skin: Negative.   Neurological: Negative.   Psychiatric/Behavioral: Negative.        Objective:   Physical Exam  Constitutional: She is oriented to person, place, and time. She appears well-developed and well-nourished. No distress.  HENT:  Head: Normocephalic and atraumatic.  Right Ear: External ear normal.  Left Ear: External ear normal.  Nose: Nose normal.  Mouth/Throat: Oropharynx is clear and moist. No oropharyngeal exudate.  Eyes: Conjunctivae and EOM are normal. Pupils are equal, round, and reactive to light. No scleral icterus.  Neck: Normal range of motion. Neck supple. No JVD present. No thyromegaly present.  Cardiovascular: Normal rate, regular rhythm, normal heart sounds and intact distal pulses.  Exam reveals no gallop and no friction rub.   No murmur heard. Pulmonary/Chest: Effort normal and breath sounds normal. No respiratory distress. She has no wheezes. She has no rales. She exhibits no tenderness.  Abdominal: Soft. Bowel sounds are normal. She exhibits no distension and no mass. There is no tenderness. There is no rebound and no guarding.  Musculoskeletal: Normal range of motion. She exhibits no edema and no tenderness.  Lymphadenopathy:    She has no cervical adenopathy.  Neurological: She is alert and oriented to person, place, and time. She has normal reflexes. No cranial nerve deficit. She exhibits normal muscle tone. Coordination normal.  Skin: Skin is warm and dry. No rash noted. No erythema.  Psychiatric: She has a normal mood and affect. Her behavior is normal. Judgment and thought content normal.          Assessment & [name]:  Well exam.

## 2013-04-20 ENCOUNTER — Telehealth: Payer: Self-pay | Admitting: *Deleted

## 2013-04-20 NOTE — Telephone Encounter (Signed)
Pt is scheduled for annual on 04/26/13 pt has been receiving fertility treatments IUI and wanted to make sure this wouldn't affect any testing done on annual? Please advise

## 2013-04-20 NOTE — Telephone Encounter (Signed)
Telephone call, states is having an IUI on Saturday, will reschedule annual exam.

## 2013-04-20 NOTE — Telephone Encounter (Signed)
Message left

## 2013-04-26 ENCOUNTER — Encounter: Payer: Self-pay | Admitting: Women's Health

## 2013-05-10 ENCOUNTER — Other Ambulatory Visit: Payer: Self-pay | Admitting: Family Medicine

## 2013-05-11 NOTE — Telephone Encounter (Signed)
Rx sent to pharmacy.  Called and spoke with pt and advised him to come into the office this week or next for testing. Pt verbalized understanding.

## 2013-06-06 ENCOUNTER — Encounter: Payer: Self-pay | Admitting: Gynecology

## 2013-06-10 ENCOUNTER — Encounter: Payer: Self-pay | Admitting: Gynecology

## 2013-06-10 ENCOUNTER — Ambulatory Visit (INDEPENDENT_AMBULATORY_CARE_PROVIDER_SITE_OTHER): Payer: BC Managed Care – PPO | Admitting: Gynecology

## 2013-06-10 VITALS — BP 126/74

## 2013-06-10 DIAGNOSIS — Z3490 Encounter for supervision of normal pregnancy, unspecified, unspecified trimester: Secondary | ICD-10-CM

## 2013-06-10 DIAGNOSIS — Z331 Pregnant state, incidental: Secondary | ICD-10-CM

## 2013-06-10 DIAGNOSIS — N912 Amenorrhea, unspecified: Secondary | ICD-10-CM

## 2013-06-10 LAB — HCG, QUANTITATIVE, PREGNANCY: HCG, BETA CHAIN, QUANT, S: 2083 m[IU]/mL

## 2013-06-10 LAB — PREGNANCY, URINE: Preg Test, Ur: POSITIVE

## 2013-06-10 NOTE — Patient Instructions (Signed)

## 2013-06-10 NOTE — Progress Notes (Signed)
   Patient is a 33 year old now gravida 2 para 1 Ab1 (miscarriage twins after intrauterine semination in July 2014 at 9/[redacted] weeks gestation) with positive home pregnancy test and confirmed in our office today by urine. Patient with mild nausea. No bleeding and no other complaints. Patient has been healthy otherwise and has started her prenatal vitamins. No prior history of pelvic surgery or PID in the past.  Exam: Abdomen: Soft nontender no rebound or guarding Pelvic: The urethra Skene glands within normal limits Vagina: No lesions or discharge Cervix: No lesions or discharge Uterus for 6 weeks size nontender Adnexa: No palpable mass or tenderness Rectal exam: Not done  Assessment/plan: First trimester pregnancy we'll obtain a quantitative beta-hCG today and repeat early next week for followup ultrasound in the next week whereby patient would be approximately [redacted] week gestation to confirm viability and estimated gestational age.

## 2013-06-14 ENCOUNTER — Other Ambulatory Visit: Payer: BC Managed Care – PPO

## 2013-06-14 DIAGNOSIS — N912 Amenorrhea, unspecified: Secondary | ICD-10-CM

## 2013-06-14 DIAGNOSIS — Z3490 Encounter for supervision of normal pregnancy, unspecified, unspecified trimester: Secondary | ICD-10-CM

## 2013-06-15 LAB — HCG, QUANTITATIVE, PREGNANCY: HCG, BETA CHAIN, QUANT, S: 7173.4 m[IU]/mL

## 2013-06-17 ENCOUNTER — Ambulatory Visit (INDEPENDENT_AMBULATORY_CARE_PROVIDER_SITE_OTHER): Payer: BC Managed Care – PPO | Admitting: Gynecology

## 2013-06-17 ENCOUNTER — Ambulatory Visit (INDEPENDENT_AMBULATORY_CARE_PROVIDER_SITE_OTHER): Payer: BC Managed Care – PPO

## 2013-06-17 ENCOUNTER — Other Ambulatory Visit: Payer: Self-pay | Admitting: Gynecology

## 2013-06-17 DIAGNOSIS — O3680X0 Pregnancy with inconclusive fetal viability, not applicable or unspecified: Secondary | ICD-10-CM

## 2013-06-17 DIAGNOSIS — Z3490 Encounter for supervision of normal pregnancy, unspecified, unspecified trimester: Secondary | ICD-10-CM | POA: Insufficient documentation

## 2013-06-17 DIAGNOSIS — O9989 Other specified diseases and conditions complicating pregnancy, childbirth and the puerperium: Secondary | ICD-10-CM

## 2013-06-17 DIAGNOSIS — Z8759 Personal history of other complications of pregnancy, childbirth and the puerperium: Secondary | ICD-10-CM

## 2013-06-17 DIAGNOSIS — Z8742 Personal history of other diseases of the female genital tract: Secondary | ICD-10-CM

## 2013-06-17 DIAGNOSIS — N912 Amenorrhea, unspecified: Secondary | ICD-10-CM

## 2013-06-17 LAB — US OB TRANSVAGINAL

## 2013-06-17 NOTE — Progress Notes (Signed)
   Patient is a 33 year old now gravida 2 para 1 Ab1 (miscarriage twins after intrauterine semination in July 2014 at 9/[redacted] weeks gestation) with positive home pregnancy test and confirmed in our office on office visit on 06/10/2013. Patient was started on prenatal vitamins. She had quantitative beta-hCG on January 16 with a value of 20,830 miu and on repeat on January 20 it had risen to 71,734 miu. Patient denies any vaginal bleeding or any unusual pain and is doing well otherwise.  Patient'slastmenstrualperiodDecember2sheisapproximately6weeksgestationwithaduedateof09/18/2015.Ultrasoundtodaydemonstratedanintrauterinegestationalsacwithayokesacseen.Possibleearlyfetalpoleseenbytooearlyforcardiacactivity.Bothovarieswerenormalandnofluidinthecul-de-sac.  Assessment/plan: First trimester intrauterine pregnancy at [redacted] weeks gestation with yolk sac seen and normal doubling time on quantitative beta hCG. Patient will return to the office in 2-3 weeks for ultrasound to confirm viability and then we will refer her to our obstetric colleagues.

## 2013-06-21 ENCOUNTER — Other Ambulatory Visit: Payer: Self-pay | Admitting: Gynecology

## 2013-06-21 DIAGNOSIS — N912 Amenorrhea, unspecified: Secondary | ICD-10-CM

## 2013-06-21 DIAGNOSIS — Z8759 Personal history of other complications of pregnancy, childbirth and the puerperium: Secondary | ICD-10-CM

## 2013-06-21 DIAGNOSIS — Z3490 Encounter for supervision of normal pregnancy, unspecified, unspecified trimester: Secondary | ICD-10-CM

## 2013-06-21 DIAGNOSIS — O3680X0 Pregnancy with inconclusive fetal viability, not applicable or unspecified: Secondary | ICD-10-CM

## 2013-06-21 DIAGNOSIS — O9989 Other specified diseases and conditions complicating pregnancy, childbirth and the puerperium: Secondary | ICD-10-CM

## 2013-07-01 ENCOUNTER — Ambulatory Visit (INDEPENDENT_AMBULATORY_CARE_PROVIDER_SITE_OTHER): Payer: BC Managed Care – PPO

## 2013-07-01 ENCOUNTER — Encounter: Payer: Self-pay | Admitting: Family Medicine

## 2013-07-01 ENCOUNTER — Ambulatory Visit (INDEPENDENT_AMBULATORY_CARE_PROVIDER_SITE_OTHER): Payer: BC Managed Care – PPO | Admitting: Family Medicine

## 2013-07-01 ENCOUNTER — Ambulatory Visit (INDEPENDENT_AMBULATORY_CARE_PROVIDER_SITE_OTHER): Payer: BC Managed Care – PPO | Admitting: Gynecology

## 2013-07-01 ENCOUNTER — Other Ambulatory Visit: Payer: BC Managed Care – PPO

## 2013-07-01 VITALS — BP 110/60 | HR 93 | Temp 99.3°F | Ht 64.75 in | Wt 164.0 lb

## 2013-07-01 DIAGNOSIS — Z3490 Encounter for supervision of normal pregnancy, unspecified, unspecified trimester: Secondary | ICD-10-CM

## 2013-07-01 DIAGNOSIS — N912 Amenorrhea, unspecified: Secondary | ICD-10-CM

## 2013-07-01 DIAGNOSIS — L731 Pseudofolliculitis barbae: Secondary | ICD-10-CM

## 2013-07-01 DIAGNOSIS — L738 Other specified follicular disorders: Secondary | ICD-10-CM

## 2013-07-01 LAB — US OB TRANSVAGINAL

## 2013-07-01 MED ORDER — MUPIROCIN CALCIUM 2 % EX CREA: 1.0000 "application " | TOPICAL_CREAM | Freq: Three times a day (TID) | CUTANEOUS | Status: DC

## 2013-07-01 NOTE — Progress Notes (Signed)
Pre visit review using our clinic review tool, if applicable. No additional management support is needed unless otherwise documented below in the visit note. 

## 2013-07-01 NOTE — Progress Notes (Signed)
   Subjective:    Patient ID: [name] [name], female    DOB: 12/03/1980, 33 y.o.   MRN: [id number]  HPI Here for 2 weeks of an ingrown hair on the left thigh. She is [redacted] weeks pregnant and feel great otherwise.   Review of Systems  Constitutional: Negative.        Objective:   Physical Exam  Constitutional: She appears well-developed and well-nourished.  Skin:  The inner proximal left thigh has a small red vesicular lesion that is tender.           Assessment & Plan:  We opened the site with a scalpel and removed a small ingrown hair. She will dress this with Bactroban cream tid for a few days

## 2013-07-01 NOTE — Progress Notes (Signed)
   Patient is a 33 year old now gravida 2 para 1 Ab1 (miscarriage twins after intrauterine semination in July 2014 at 9/[redacted] weeks gestation) who presented to the office today to discuss the results of her ultrasound to confirm fetal viability and dates. Her quantitative beta hCGs have been as follows:   Results for [name] [name] [name], [name] [name] (MRN [id number]) as of 07/01/2013 16:42  Ref. Range 06/10/2013 09:44 06/14/2013 09:57  hCG, Beta Chain, Quant, S No range found 2083.0 7173.4    She is doing well minimal nausea no vaginal bleeding currently on prenatal vitamins.  Ultrasound today: Intrauterine gestational sac was seen along with a yolk sac. Fetal pole with cardiac activity was evident. Both ovaries were normal. No free fluid in the cul-de-sac.  Assessment/plan: Gravida 2 para 0 Ab1 at [redacted] weeks gestation by last menstrual period and 7 weeks and 5 days by ultrasound with a due date 02/12/2014. The patient will be referred to my obstetrical colleagues for the remainder of her pregnancy.

## 2013-07-04 ENCOUNTER — Other Ambulatory Visit: Payer: BC Managed Care – PPO

## 2013-07-04 ENCOUNTER — Ambulatory Visit: Payer: BC Managed Care – PPO | Admitting: Gynecology

## 2014-02-10 ENCOUNTER — Encounter (HOSPITAL_COMMUNITY): Payer: Self-pay

## 2014-02-10 ENCOUNTER — Other Ambulatory Visit: Payer: Self-pay | Admitting: Obstetrics and Gynecology

## 2014-02-10 ENCOUNTER — Encounter (HOSPITAL_COMMUNITY)
Admission: RE | Admit: 2014-02-10 | Discharge: 2014-02-10 | Disposition: A | Discharge status: Home / Self Care | Payer: BC Managed Care – PPO | Source: Ambulatory Visit | Attending: Obstetrics and Gynecology | Admitting: Obstetrics and Gynecology

## 2014-02-10 LAB — CBC
HEMATOCRIT: 38.4 % (ref 36.0–46.0)
HEMOGLOBIN: 12.9 g/dL (ref 12.0–15.0)
MCH: 30.1 pg (ref 26.0–34.0)
MCHC: 33.6 g/dL (ref 30.0–36.0)
MCV: 89.7 fL (ref 78.0–100.0)
Platelets: 215 10*3/uL (ref 150–400)
RBC: 4.28 MIL/uL (ref 3.87–5.11)
RDW: 14.4 % (ref 11.5–15.5)
WBC: 14.8 10*3/uL — AB (ref 4.0–10.5)

## 2014-02-10 LAB — RPR

## 2014-02-10 LAB — TYPE AND SCREEN
ABO/RH(D): O POS
Antibody Screen: NEGATIVE

## 2014-02-10 NOTE — Patient Instructions (Addendum)
   Your procedure is scheduled on: sept 19 at 930am  Enter through the Main Entrance of [facility] at: 8am Pick up the phone at the desk and dial [phone] and inform us of your arrival.  Please call this number if you have any problems the morning of surgery: [phone]  Remember: Do not eat food after midnight:sept 18 Do not drink clear liquids after:sept 18 Take these medicines the morning of surgery with a SIP OF WATER:  Do not wear jewelry, make-up, or FINGER nail polish No metal in your hair or on your body. Do not wear lotions, powders, perfumes.  You may wear deodorant.  Do not bring valuables to the hospital. Contacts, dentures or bridgework may not be worn into surgery.  Leave suitcase in the car. After Surgery it may be brought to your room. For patients being admitted to the hospital, checkout time is 11:00am the day of discharge.    Patients discharged on the day of surgery will not be allowed to drive home.

## 2014-02-11 ENCOUNTER — Inpatient Hospital Stay (HOSPITAL_COMMUNITY)
Admission: AD | Admit: 2014-02-11 | Discharge: 2014-02-14 | DRG: 765 | Disposition: A | Discharge status: Home / Self Care | Payer: BC Managed Care – PPO | Source: Ambulatory Visit | Attending: Obstetrics and Gynecology | Admitting: Obstetrics and Gynecology

## 2014-02-11 ENCOUNTER — Encounter (HOSPITAL_COMMUNITY): Payer: BC Managed Care – PPO

## 2014-02-11 ENCOUNTER — Encounter (HOSPITAL_COMMUNITY)
Admission: AD | Disposition: A | Discharge status: Home / Self Care | Payer: Self-pay | Source: Ambulatory Visit | Attending: Obstetrics and Gynecology

## 2014-02-11 ENCOUNTER — Inpatient Hospital Stay (HOSPITAL_COMMUNITY): Payer: BC Managed Care – PPO

## 2014-02-11 ENCOUNTER — Encounter (HOSPITAL_COMMUNITY): Payer: Self-pay | Admitting: *Deleted

## 2014-02-11 DIAGNOSIS — Z833 Family history of diabetes mellitus: Secondary | ICD-10-CM

## 2014-02-11 DIAGNOSIS — O480 Post-term pregnancy: Secondary | ICD-10-CM | POA: Diagnosis present

## 2014-02-11 DIAGNOSIS — D62 Acute posthemorrhagic anemia: Secondary | ICD-10-CM | POA: Diagnosis not present

## 2014-02-11 DIAGNOSIS — O9903 Anemia complicating the puerperium: Secondary | ICD-10-CM | POA: Diagnosis present

## 2014-02-11 DIAGNOSIS — Z800 Family history of malignant neoplasm of digestive organs: Secondary | ICD-10-CM

## 2014-02-11 DIAGNOSIS — Z8249 Family history of ischemic heart disease and other diseases of the circulatory system: Secondary | ICD-10-CM | POA: Diagnosis not present

## 2014-02-11 DIAGNOSIS — Z808 Family history of malignant neoplasm of other organs or systems: Secondary | ICD-10-CM | POA: Diagnosis not present

## 2014-02-11 DIAGNOSIS — Z823 Family history of stroke: Secondary | ICD-10-CM | POA: Diagnosis not present

## 2014-02-11 DIAGNOSIS — O409XX0 Polyhydramnios, unspecified trimester, not applicable or unspecified: Secondary | ICD-10-CM | POA: Diagnosis present

## 2014-02-11 DIAGNOSIS — O3660X0 Maternal care for excessive fetal growth, unspecified trimester, not applicable or unspecified: Principal | ICD-10-CM | POA: Diagnosis present

## 2014-02-11 SURGERY — Surgical Case
Anesthesia: Spinal | Site: Abdomen

## 2014-02-11 MED ORDER — PHENYLEPHRINE 8 MG IN D5W 100 ML (0.08MG/ML) PREMIX OPTIME
INJECTION | INTRAVENOUS | Status: DC | PRN
  Administered 2014-02-11: 60 ug/min via INTRAVENOUS

## 2014-02-11 MED ORDER — FENTANYL CITRATE 0.05 MG/ML IJ SOLN
INTRAMUSCULAR | Status: AC
  Filled 2014-02-11: qty 2

## 2014-02-11 MED ORDER — PRENATAL MULTIVITAMIN CH
1.0000 | ORAL_TABLET | Freq: Every day | ORAL | Status: DC
  Administered 2014-02-12 – 2014-02-13 (×2): 1 via ORAL
  Filled 2014-02-11 (×2): qty 1

## 2014-02-11 MED ORDER — MENTHOL 3 MG MT LOZG
1.0000 | LOZENGE | OROMUCOSAL | Status: DC | PRN
  Administered 2014-02-11: 3 mg via ORAL
  Filled 2014-02-11: qty 9

## 2014-02-11 MED ORDER — OXYCODONE-ACETAMINOPHEN 5-325 MG PO TABS
1.0000 | ORAL_TABLET | ORAL | Status: DC | PRN
  Administered 2014-02-12 – 2014-02-14 (×6): 1 via ORAL
  Filled 2014-02-11 (×6): qty 1

## 2014-02-11 MED ORDER — KETOROLAC TROMETHAMINE 30 MG/ML IJ SOLN: 30.0000 mg | Freq: Four times a day (QID) | INTRAMUSCULAR | Status: DC | PRN

## 2014-02-11 MED ORDER — NALBUPHINE HCL 10 MG/ML IJ SOLN: 5.0000 mg | INTRAMUSCULAR | Status: DC | PRN

## 2014-02-11 MED ORDER — NALOXONE HCL 0.4 MG/ML IJ SOLN: 0.4000 mg | INTRAMUSCULAR | Status: DC | PRN

## 2014-02-11 MED ORDER — SODIUM CHLORIDE 0.9 % IJ SOLN
INTRAMUSCULAR | Status: AC
  Filled 2014-02-11: qty 50

## 2014-02-11 MED ORDER — BUPIVACAINE HCL (PF) 0.25 % IJ SOLN
INTRAMUSCULAR | Status: AC
Start: 2014-02-11 — End: 2014-02-11
  Filled 2014-02-11: qty 30

## 2014-02-11 MED ORDER — DIPHENHYDRAMINE HCL 25 MG PO CAPS: 25.0000 mg | ORAL_CAPSULE | ORAL | Status: DC | PRN

## 2014-02-11 MED ORDER — DIBUCAINE 1 % RE OINT: 1.0000 "application " | TOPICAL_OINTMENT | RECTAL | Status: DC | PRN

## 2014-02-11 MED ORDER — OXYTOCIN 40 UNITS IN LACTATED RINGERS INFUSION - SIMPLE MED: 62.5000 mL/h | INTRAVENOUS | Status: AC

## 2014-02-11 MED ORDER — NALBUPHINE HCL 10 MG/ML IJ SOLN: 5.0000 mg | Freq: Once | INTRAMUSCULAR | Status: AC | PRN

## 2014-02-11 MED ORDER — OXYTOCIN 10 UNIT/ML IJ SOLN
40.0000 [IU] | INTRAVENOUS | Status: DC | PRN
  Administered 2014-02-11: 40 [IU] via INTRAVENOUS

## 2014-02-11 MED ORDER — SIMETHICONE 80 MG PO CHEW
80.0000 mg | CHEWABLE_TABLET | ORAL | Status: DC | PRN
  Administered 2014-02-12: 80 mg via ORAL

## 2014-02-11 MED ORDER — SCOPOLAMINE 1 MG/3DAYS TD PT72
1.0000 | MEDICATED_PATCH | Freq: Once | TRANSDERMAL | Status: DC
  Filled 2014-02-11: qty 1

## 2014-02-11 MED ORDER — KETOROLAC TROMETHAMINE 30 MG/ML IJ SOLN
INTRAMUSCULAR | Status: AC
  Filled 2014-02-11: qty 1

## 2014-02-11 MED ORDER — FENTANYL CITRATE 0.05 MG/ML IJ SOLN
INTRAMUSCULAR | Status: DC | PRN
  Administered 2014-02-11: 25 ug via INTRATHECAL

## 2014-02-11 MED ORDER — ONDANSETRON HCL 4 MG/2ML IJ SOLN: 4.0000 mg | Freq: Three times a day (TID) | INTRAMUSCULAR | Status: DC | PRN

## 2014-02-11 MED ORDER — ONDANSETRON HCL 4 MG PO TABS: 4.0000 mg | ORAL_TABLET | ORAL | Status: DC | PRN

## 2014-02-11 MED ORDER — SCOPOLAMINE 1 MG/3DAYS TD PT72
1.0000 | MEDICATED_PATCH | Freq: Once | TRANSDERMAL | Status: DC
  Administered 2014-02-11: 1.5 mg via TRANSDERMAL

## 2014-02-11 MED ORDER — LACTATED RINGERS IV SOLN
INTRAVENOUS | Status: DC | PRN
  Administered 2014-02-11 (×3): via INTRAVENOUS

## 2014-02-11 MED ORDER — ONDANSETRON HCL 4 MG/2ML IJ SOLN: 4.0000 mg | INTRAMUSCULAR | Status: DC | PRN

## 2014-02-11 MED ORDER — LACTATED RINGERS IV SOLN
INTRAVENOUS | Status: DC | PRN
  Administered 2014-02-11: 10:00:00 via INTRAVENOUS

## 2014-02-11 MED ORDER — DIPHENHYDRAMINE HCL 50 MG/ML IJ SOLN: 12.5000 mg | INTRAMUSCULAR | Status: DC | PRN

## 2014-02-11 MED ORDER — SODIUM CHLORIDE 0.9 % IJ SOLN: 3.0000 mL | INTRAMUSCULAR | Status: DC | PRN

## 2014-02-11 MED ORDER — MORPHINE SULFATE (PF) 0.5 MG/ML IJ SOLN
INTRAMUSCULAR | Status: DC | PRN
  Administered 2014-02-11: .1 mg via INTRATHECAL

## 2014-02-11 MED ORDER — SCOPOLAMINE 1 MG/3DAYS TD PT72
MEDICATED_PATCH | TRANSDERMAL | Status: AC
  Administered 2014-02-11: 1.5 mg via TRANSDERMAL
  Filled 2014-02-11: qty 1

## 2014-02-11 MED ORDER — OXYCODONE-ACETAMINOPHEN 5-325 MG PO TABS
2.0000 | ORAL_TABLET | ORAL | Status: DC | PRN
Start: 2014-02-11 — End: 2014-02-14
  Administered 2014-02-12 – 2014-02-13 (×8): 2 via ORAL
  Filled 2014-02-11 (×8): qty 2

## 2014-02-11 MED ORDER — WITCH HAZEL-GLYCERIN EX PADS: 1.0000 "application " | MEDICATED_PAD | CUTANEOUS | Status: DC | PRN

## 2014-02-11 MED ORDER — ONDANSETRON HCL 4 MG/2ML IJ SOLN
INTRAMUSCULAR | Status: AC
  Filled 2014-02-11: qty 2

## 2014-02-11 MED ORDER — PHENYLEPHRINE 8 MG IN D5W 100 ML (0.08MG/ML) PREMIX OPTIME
INJECTION | INTRAVENOUS | Status: AC
  Filled 2014-02-11: qty 100

## 2014-02-11 MED ORDER — MORPHINE SULFATE 0.5 MG/ML IJ SOLN
INTRAMUSCULAR | Status: AC
Start: 2014-02-11 — End: 2014-02-11
  Filled 2014-02-11: qty 10

## 2014-02-11 MED ORDER — LACTATED RINGERS IV SOLN
Freq: Once | INTRAVENOUS | Status: AC
  Administered 2014-02-11: 1000 mL/h via INTRAVENOUS

## 2014-02-11 MED ORDER — LACTATED RINGERS IV SOLN
INTRAVENOUS | Status: DC
  Administered 2014-02-11: 20:00:00 via INTRAVENOUS

## 2014-02-11 MED ORDER — NALOXONE HCL 1 MG/ML IJ SOLN
1.0000 ug/kg/h | INTRAVENOUS | Status: DC | PRN
  Filled 2014-02-11: qty 2

## 2014-02-11 MED ORDER — TETANUS-DIPHTH-ACELL PERTUSSIS 5-2.5-18.5 LF-MCG/0.5 IM SUSP: 0.5000 mL | Freq: Once | INTRAMUSCULAR | Status: DC

## 2014-02-11 MED ORDER — SENNOSIDES-DOCUSATE SODIUM 8.6-50 MG PO TABS
2.0000 | ORAL_TABLET | ORAL | Status: DC
  Administered 2014-02-12 – 2014-02-13 (×3): 2 via ORAL
  Filled 2014-02-11 (×3): qty 2

## 2014-02-11 MED ORDER — METHYLERGONOVINE MALEATE 0.2 MG PO TABS: 0.2000 mg | ORAL_TABLET | ORAL | Status: DC | PRN

## 2014-02-11 MED ORDER — OXYTOCIN 10 UNIT/ML IJ SOLN
INTRAMUSCULAR | Status: AC
  Filled 2014-02-11: qty 4

## 2014-02-11 MED ORDER — CEFAZOLIN SODIUM-DEXTROSE 2-3 GM-% IV SOLR
INTRAVENOUS | Status: AC
  Administered 2014-02-11: 2 g via INTRAVENOUS
  Filled 2014-02-11: qty 50

## 2014-02-11 MED ORDER — SIMETHICONE 80 MG PO CHEW
80.0000 mg | CHEWABLE_TABLET | Freq: Three times a day (TID) | ORAL | Status: DC
  Administered 2014-02-11 – 2014-02-14 (×8): 80 mg via ORAL
  Filled 2014-02-11 (×8): qty 1

## 2014-02-11 MED ORDER — CEFAZOLIN SODIUM-DEXTROSE 2-3 GM-% IV SOLR: 2.0000 g | INTRAVENOUS | Status: DC

## 2014-02-11 MED ORDER — MEPERIDINE HCL 25 MG/ML IJ SOLN: 6.2500 mg | INTRAMUSCULAR | Status: DC | PRN

## 2014-02-11 MED ORDER — METHYLERGONOVINE MALEATE 0.2 MG/ML IJ SOLN: 0.2000 mg | INTRAMUSCULAR | Status: DC | PRN

## 2014-02-11 MED ORDER — BUPIVACAINE HCL (PF) 0.25 % IJ SOLN
INTRAMUSCULAR | Status: DC | PRN
  Administered 2014-02-11: 10 mL

## 2014-02-11 MED ORDER — ZOLPIDEM TARTRATE 5 MG PO TABS: 5.0000 mg | ORAL_TABLET | Freq: Every evening | ORAL | Status: DC | PRN

## 2014-02-11 MED ORDER — IBUPROFEN 600 MG PO TABS
600.0000 mg | ORAL_TABLET | Freq: Four times a day (QID) | ORAL | Status: DC
  Administered 2014-02-11 – 2014-02-14 (×11): 600 mg via ORAL
  Filled 2014-02-11 (×10): qty 1

## 2014-02-11 MED ORDER — ONDANSETRON HCL 4 MG/2ML IJ SOLN
INTRAMUSCULAR | Status: DC | PRN
  Administered 2014-02-11: 4 mg via INTRAVENOUS

## 2014-02-11 MED ORDER — KETOROLAC TROMETHAMINE 30 MG/ML IJ SOLN
30.0000 mg | Freq: Four times a day (QID) | INTRAMUSCULAR | Status: DC | PRN
  Administered 2014-02-11: 30 mg via INTRAVENOUS

## 2014-02-11 MED ORDER — SIMETHICONE 80 MG PO CHEW
80.0000 mg | CHEWABLE_TABLET | ORAL | Status: DC
  Administered 2014-02-12 – 2014-02-13 (×2): 80 mg via ORAL
  Filled 2014-02-11 (×3): qty 1

## 2014-02-11 MED ORDER — LANOLIN HYDROUS EX OINT
1.0000 | TOPICAL_OINTMENT | CUTANEOUS | Status: DC | PRN
Start: 2014-02-11 — End: 2014-02-14

## 2014-02-11 MED ORDER — DIPHENHYDRAMINE HCL 25 MG PO CAPS: 25.0000 mg | ORAL_CAPSULE | Freq: Four times a day (QID) | ORAL | Status: DC | PRN

## 2014-02-11 SURGICAL SUPPLY — 40 items
ADH SKN CLS APL DERMABOND .7 (GAUZE/BANDAGES/DRESSINGS) ×1
BLADE SURG 10 STRL SS (BLADE) ×6 IMPLANT
CLAMP CORD UMBIL (MISCELLANEOUS) IMPLANT
CLOTH BEACON ORANGE TIMEOUT ST (SAFETY) ×3 IMPLANT
CONTAINER PREFILL 10% NBF 15ML (MISCELLANEOUS) IMPLANT
DERMABOND ADVANCED (GAUZE/BANDAGES/DRESSINGS) ×2
DERMABOND ADVANCED .7 DNX12 (GAUZE/BANDAGES/DRESSINGS) IMPLANT
DRAPE SHEET LG 3/4 BI-LAMINATE (DRAPES) IMPLANT
DRSG OPSITE POSTOP 4X10 (GAUZE/BANDAGES/DRESSINGS) ×3 IMPLANT
DURAPREP 26ML APPLICATOR (WOUND CARE) ×3 IMPLANT
ELECT REM PT RETURN 9FT ADLT (ELECTROSURGICAL) ×3
ELECTRODE REM PT RTRN 9FT ADLT (ELECTROSURGICAL) ×1 IMPLANT
EXTRACTOR VACUUM M CUP 4 TUBE (SUCTIONS) ×1 IMPLANT
EXTRACTOR VACUUM M CUP 4' TUBE (SUCTIONS) ×1
GLOVE BIO SURGEON STRL SZ7.5 (GLOVE) ×3 IMPLANT
GOWN STRL REUS W/TWL LRG LVL3 (GOWN DISPOSABLE) ×6 IMPLANT
KIT ABG SYR 3ML LUER SLIP (SYRINGE) IMPLANT
NDL HYPO 25X1 1.5 SAFETY (NEEDLE) ×1 IMPLANT
NDL HYPO 25X5/8 SAFETYGLIDE (NEEDLE) IMPLANT
NDL SPNL 20GX3.5 QUINCKE YW (NEEDLE) IMPLANT
NEEDLE HYPO 25X1 1.5 SAFETY (NEEDLE) ×3 IMPLANT
NEEDLE HYPO 25X5/8 SAFETYGLIDE (NEEDLE) IMPLANT
NEEDLE SPNL 20GX3.5 QUINCKE YW (NEEDLE) IMPLANT
NS IRRIG 1000ML POUR BTL (IV SOLUTION) ×3 IMPLANT
PACK C SECTION WH (CUSTOM PROCEDURE TRAY) ×3 IMPLANT
STAPLER VISISTAT 35W (STAPLE) IMPLANT
SUT MNCRL 0 VIOLET CTX 36 (SUTURE) ×2 IMPLANT
SUT MNCRL AB 3-0 PS2 27 (SUTURE) IMPLANT
SUT MON AB 2-0 CT1 27 (SUTURE) ×3 IMPLANT
SUT MON AB-0 CT1 36 (SUTURE) ×6 IMPLANT
SUT MONOCRYL 0 CTX 36 (SUTURE) ×4
SUT PLAIN 0 NONE (SUTURE) IMPLANT
SUT PLAIN 2 0 (SUTURE)
SUT PLAIN 2 0 XLH (SUTURE) IMPLANT
SUT PLAIN ABS 2-0 CT1 27XMFL (SUTURE) IMPLANT
SYR 20CC LL (SYRINGE) IMPLANT
SYR CONTROL 10ML LL (SYRINGE) ×3 IMPLANT
TOWEL OR 17X24 6PK STRL BLUE (TOWEL DISPOSABLE) ×3 IMPLANT
TRAY FOLEY CATH 14FR (SET/KITS/TRAYS/PACK) ×3 IMPLANT
WATER STERILE IRR 1000ML POUR (IV SOLUTION) ×3 IMPLANT

## 2014-02-11 NOTE — Addendum Note (Signed)
Addendum created 02/11/14 1553 by Graciela Husbands, CRNA   Modules edited: Charges VN, Notes Section   Notes Section:  File: [id number]

## 2014-02-11 NOTE — Anesthesia Postprocedure Evaluation (Signed)
Anesthesia Post Note  Patient: [name] [name]  Procedure(s) Performed: Procedure(s) (LRB): Primary CESAREAN SECTION (N/A)  Anesthesia type: Spinal  Patient location: Mother/Baby  Post pain: Pain level controlled  Post assessment: Post-op Vital signs reviewed  Last Vitals:  Filed Vitals:   02/11/14 1253  BP: 111/62  Pulse: 84  Temp: 36.8 C  Resp:     Post vital signs: Reviewed  Level of consciousness: awake  Complications: No apparent anesthesia complications

## 2014-02-11 NOTE — Lactation Note (Signed)
This note was copied from the chart of [name] [name]. Lactation Consultation Note  Patient Name: [name] Denesha Brouse [id number] Date: 02/11/2014 Reason for consult: Initial assessment Baby asleep STS on Mom's chest. Basic teaching reviewed with parents. Hand expression demonstrated. Aerola edema present, demonstrated reverse pressure massage. Lactation brochure left for review, advised of OP services and support group. Will revisit with next feeding to assist with latch.   Maternal Data Formula Feeding for Exclusion: No Has patient been taught Hand Expression?: Yes Does the patient have breastfeeding experience prior to this delivery?: No  Feeding    LATCH Score/Interventions                      Lactation Tools Discussed/Used     Consult Status Consult Status: Follow-up Date: 02/11/14 Follow-up type: In-patient    Alfred Levins 02/11/2014, 8:41 PM

## 2014-02-11 NOTE — Transfer of Care (Signed)
Immediate Anesthesia Transfer of Care Note  Patient: [name] [name]  Procedure(s) Performed: Procedure(s) with comments: Primary CESAREAN SECTION (N/A) - EDD: 02/10/14  Patient Location: PACU  Anesthesia Type:Spinal  Level of Consciousness: awake, alert  and oriented  Airway & Oxygen Therapy: Patient Spontanous Breathing  Post-op Assessment: Report given to PACU RN and Post -op Vital signs reviewed and stable  Post vital signs: Reviewed and stable  Complications: No apparent anesthesia complications

## 2014-02-11 NOTE — H&P (Signed)
[name] [name] is a 33 y.o. female presenting for primary Cesarean Section.  Maternal Medical History:  Fetal activity: Perceived fetal activity is normal.   Last perceived fetal movement was within the past hour.    Prenatal complications: Polyhydramnios.   Prenatal Complications - Diabetes: none.    OB History   Grav Para Term Preterm Abortions TAB SAB Ect Mult Living   Past Medical History  Diagnosis Date  . Allergy   . Anxiety     travel  . Dysmenorrhea    Past Surgical History  Procedure Laterality Date  . Tonsilectomy, adenoidectomy, bilateral myringotomy and tubes    . Refractive surgery    . Eye surgery      bilateral lasik  . Dilation and evacuation N/A 12/07/2012    Procedure: DILATATION AND EVACUATION;  Surgeon: Zelphia Cairo, MD;  Location: [facility];  Service: Gynecology;  Laterality: N/A;   Family History: family history includes Alcohol abuse in her mother; Cancer in her maternal grandmother and paternal grandfather; Diabetes in her paternal aunt; Hypertension in her father. Social History:  reports that she has never smoked. She has never used smokeless tobacco. She reports that she does not drink alcohol or use illicit drugs.   Prenatal Transfer Tool  Maternal Diabetes: No Genetic Screening: Normal Maternal Ultrasounds/Referrals: Normal Fetal Ultrasounds or other Referrals:  None Maternal Substance Abuse:  No Significant Maternal Medications:  None Significant Maternal Lab Results:  None Other Comments:  None  Review of Systems  All other systems reviewed and are negative.     Blood pressure 128/72, pulse 105, temperature 98.4 F (36.9 C), temperature source Oral, resp. rate 20, last menstrual period 05/06/2013, SpO2 100.00%. Maternal Exam:  Uterine Assessment: Contraction strength is mild.  Contraction frequency is rare.   Abdomen: Patient reports no abdominal tenderness. Fetal presentation: vertex  Introitus: Normal vulva.  Normal vagina.  Ferning test: not done.  Nitrazine test: not done. Amniotic fluid character: not assessed.  Pelvis: adequate for delivery.   Cervix: Cervix evaluated by digital exam.     Physical Exam  Constitutional: She is oriented to person, place, and time. She appears well-developed and well-nourished.  HENT:  Head: Normocephalic and atraumatic.  Eyes: Pupils are equal, round, and reactive to light.  Cardiovascular: Normal rate and regular rhythm.   Respiratory: Effort normal and breath sounds normal.  GI: Soft. Bowel sounds are normal.  Genitourinary: Vagina normal and uterus normal.  Musculoskeletal: Normal range of motion.  Neurological: She is alert and oriented to person, place, and time.  Skin: Skin is warm and dry.  Psychiatric: She has a normal mood and affect. Her behavior is normal. Judgment and thought content normal.    Prenatal labs: ABO, Rh: --/--/O POS (09/18 1135) Antibody: NEG (09/18 1135) Rubella:   RPR: NON REAC (09/18 1135)  HBsAg:    HIV:    GBS:     Assessment/Plan: 40 weeks LGA with polyhydramnios Primary Cesarean Section per pt request See dictated H&P   Demetrice Amstutz J 02/11/2014, 9:26 AM

## 2014-02-11 NOTE — Anesthesia Procedure Notes (Signed)

## 2014-02-11 NOTE — Progress Notes (Signed)
Patient ID: [name] [name], female   DOB: [birth year], 33 y.o.   MRN: [id number] Patient seen and examined. Consent witnessed and signed. No changes noted. Update completed.

## 2014-02-11 NOTE — Anesthesia Postprocedure Evaluation (Signed)
  Anesthesia Post-op Note  Patient: [name] [name]  Procedure(s) Performed: Procedure(s) with comments: Primary CESAREAN SECTION (N/A) - EDD: 02/10/14  Patient is awake, responsive, moving her legs, and has signs of resolution of her numbness. Pain and nausea are reasonably well controlled. Vital signs are stable and clinically acceptable. Oxygen saturation is clinically acceptable. There are no apparent anesthetic complications at this time. Patient is ready for discharge.

## 2014-02-11 NOTE — Anesthesia Preprocedure Evaluation (Signed)
Anesthesia Evaluation  Patient identified by MRN, date of birth, ID band Patient awake    Reviewed: Allergy & Precautions, H&P , Patient's Chart, lab work & pertinent test results  Airway Mallampati: II TM Distance: >3 FB Neck ROM: full    Dental no notable dental hx.    Pulmonary  breath sounds clear to auscultation  Pulmonary exam normal       Cardiovascular Exercise Tolerance: Good Rhythm:regular Rate:Normal     Neuro/Psych    GI/Hepatic GERD-  Medicated,  Endo/Other    Renal/GU      Musculoskeletal   Abdominal   Peds  Hematology   Anesthesia Other Findings   Reproductive/Obstetrics                           Anesthesia Physical Anesthesia Plan  ASA: II  Anesthesia Plan: Spinal   Post-op Pain Management:    Induction:   Airway Management Planned:   Additional Equipment:   Intra-op Plan:   Post-operative Plan:   Informed Consent: I have reviewed the patients History and Physical, chart, labs and discussed the procedure including the risks, benefits and alternatives for the proposed anesthesia with the patient or authorized representative who has indicated his/her understanding and acceptance.   Dental Advisory Given  Plan Discussed with: CRNA  Anesthesia Plan Comments: (Lab work confirmed with CRNA in room. Platelets okay. Discussed spinal anesthetic, and patient consents to the procedure:  included risk of possible headache,backache, failed block, allergic reaction, and nerve injury. This patient was asked if she had any questions or concerns before the procedure started. )        Anesthesia Quick Evaluation

## 2014-02-11 NOTE — Lactation Note (Signed)
This note was copied from the chart of [name] [name]. Lactation Consultation Note  Patient Name: [name] Tyrell Brereton [id number] Date: 02/11/2014 Reason for consult: Follow-up assessment Demonstrated to Mom how to use manual pump to help with latch. After several attempts baby was able to latch and sustain the latch. Reviewed positioning and how to use breast compression to help with latch. Encouraged to pre-pump before attempting to latch. Encouraged to ask for assist as needed.   Maternal Data Formula Feeding for Exclusion: No Has patient been taught Hand Expression?: Yes Does the patient have breastfeeding experience prior to this delivery?: No  Feeding Feeding Type: Breast Fed  LATCH Score/Interventions Latch: Repeated attempts needed to sustain latch, nipple held in mouth throughout feeding, stimulation needed to elicit sucking reflex. Intervention(s): Adjust position;Assist with latch;Breast massage;Breast compression  Audible Swallowing: A few with stimulation  Type of Nipple: Everted at rest and after stimulation (short shaft, aerola edema, flatten w/breast compression)  Comfort (Breast/Nipple): Soft / non-tender     Hold (Positioning): Assistance needed to correctly position infant at breast and maintain latch. Intervention(s): Breastfeeding basics reviewed;Support Pillows;Position options;Skin to skin  LATCH Score: 7  Lactation Tools Discussed/Used Tools: Pump Breast pump type: Manual   Consult Status Consult Status: Follow-up Date: 02/12/14 Follow-up type: In-patient    Alfred Levins 02/11/2014, 8:44 PM

## 2014-02-11 NOTE — Consult Note (Signed)
Neonatology Note:   Attendance at C-section:    I was asked by Dr. Billy Coast to attend this primary C/S at term at patient's request. The mother is a G2P0A1 O pos, GBS neg with polyhydramnios and known LGA infant. ROM at delivery, fluid clear. Vacuum-assisted delivery, CAN times 1 loosely. Infant vigorous with good spontaneous cry and tone. Needed only minimal bulb suctioning. Ap 9/9. Lungs clear to ausc in DR. To CN to care of Pediatrician.   Doretha Sou, MD

## 2014-02-11 NOTE — Op Note (Signed)
Cesarean Section Procedure Note  Indications: macrosomia, patient declines vag del attempt and Polyhydramnios  Pre-operative Diagnosis: 40 week 1 day pregnancy.  Post-operative Diagnosis: same  Surgeon: Lenoard Aden   Assistants: Denyse Amass, CNM  Anesthesia: Local anesthesia 0.25.% bupivacaine and Spinal anesthesia  ASA Class: 2  Procedure Details  The patient was seen in the Holding Room. The risks, benefits, complications, treatment options, and expected outcomes were discussed with the patient.  The patient concurred with the proposed plan, giving informed consent. The risks of anesthesia, infection, bleeding and possible injury to other organs discussed. Injury to bowel, bladder, or ureter with possible need for repair discussed. Possible need for transfusion with secondary risks of hepatitis or HIV acquisition discussed. Post operative complications to include but not limited to DVT, PE and Pneumonia noted. The site of surgery properly noted/marked. The patient was taken to Operating Room # 9, identified as Kathia Covington and the procedure verified as C-Section Delivery. A Time Out was held and the above information confirmed.  After induction of anesthesia, the patient was draped and prepped in the usual sterile manner. A Pfannenstiel incision was made and carried down through the subcutaneous tissue to the fascia. Fascial incision was made and extended transversely using Mayo scissors. The fascia was separated from the underlying rectus tissue superiorly and inferiorly. The peritoneum was identified and entered. Peritoneal incision was extended longitudinally. The utero-vesical peritoneal reflection was incised transversely and the bladder flap was bluntly freed from the lower uterine segment. A low transverse uterine incision(Kerr hysterotomy) was made. Delivered from OT presentation was a  Female with Vacuum assistance x one pull with Apgar scores of 9 at one minute and 9 at five minutes.  Bulb suctioning gently performed. Neonatal team in attendance.After the umbilical cord was clamped and cut cord blood was obtained for evaluation. The placenta was removed intact and appeared normal. The uterus was curetted with a dry lap pack. Good hemostasis was noted.The uterine outline, tubes and ovaries appeared normal. The uterine incision was closed with running locked sutures of 0 Monocryl x 2 layers. Hemostasis was observed. Lavage was carried out until clear.The parietal peritoneum was closed with a running 2-0 Monocryl suture. The fascia was then reapproximated with running sutures of 0 Monocryl. The skin was reapproximated with 3-0 monocryl after [place] closure with 2-0 plain.  Instrument, sponge, and needle counts were correct prior the abdominal closure and at the conclusion of the case.   Findings: As noted  Estimated Blood Loss:  300 mL         Drains: foley                 Specimens: placenta                 Complications:  None; patient tolerated the procedure well.         Disposition: PACU - hemodynamically stable.         Condition: stable  Attending Attestation: I performed the procedure.

## 2014-02-12 DIAGNOSIS — D62 Acute posthemorrhagic anemia: Secondary | ICD-10-CM | POA: Diagnosis not present

## 2014-02-12 LAB — CBC
HCT: 31.8 % — ABNORMAL LOW (ref 36.0–46.0)
Hemoglobin: 10.5 g/dL — ABNORMAL LOW (ref 12.0–15.0)
MCH: 29.8 pg (ref 26.0–34.0)
MCHC: 33 g/dL (ref 30.0–36.0)
MCV: 90.3 fL (ref 78.0–100.0)
PLATELETS: 177 10*3/uL (ref 150–400)
RBC: 3.52 MIL/uL — ABNORMAL LOW (ref 3.87–5.11)
RDW: 14.5 % (ref 11.5–15.5)
WBC: 14.1 10*3/uL — ABNORMAL HIGH (ref 4.0–10.5)

## 2014-02-12 MED ORDER — POLYSACCHARIDE IRON COMPLEX 150 MG PO CAPS
150.0000 mg | ORAL_CAPSULE | Freq: Every day | ORAL | Status: DC
  Administered 2014-02-12 – 2014-02-14 (×3): 150 mg via ORAL
  Filled 2014-02-12 (×3): qty 1

## 2014-02-12 NOTE — Progress Notes (Signed)
POD # 1  Subjective: Pt reports feeling well/ Pain controlled with Motrin and Percocet Tolerating po/ Voiding without problems/ No n/v/ Flatus absent Activity: ad lib Bleeding is light Newborn info:  Information for the patient's newborn:  [name] [name], [name] [name] [[id number]]  female  / Circumcision: planning today/ Feeding: breast   Objective:  VS:  Filed Vitals:   02/12/14 0020 02/12/14 0352 02/12/14 0656 02/12/14 0800  BP: 99/51 102/51  106/57  Pulse: 76 85  91  Temp: 98.2 F (36.8 C)  98.3 F (36.8 C) 97.9 F (36.6 C)  TempSrc: Oral  Oral Oral  Resp: Weight:      SpO2: 95% 95%  99%     I&O: Intake/Output     09/19 0701 - 09/20 0700 09/20 0701 - 09/21 0700   I.V. (mL/kg) 3400 (37.5)    Total Intake(mL/kg) 3400 (37.5)    Urine (mL/kg/hr) 2875 400 (0.8)   Blood 600    Total Output 3475 400   Net -75 -400           Recent Labs  02/10/14 1135 02/12/14 0548  WBC 14.8* 14.1*  HGB 12.9 10.5*  HCT 38.4 31.8*  PLT 215 177    Blood type: --/--/O POS (09/18 1135) Rubella:      Physical Exam:  General: alert and cooperative CV: Regular rate and rhythm Resp: CTA bilaterally Abdomen: soft, nontender, normal bowel sounds Incision: healing well, no drainage, no erythema, no hernia, no seroma, no swelling, well approximated, honeycomb dsg c/d/i Uterine Fundus: firm, below umbilicus, nontender Lochia: minimal Ext: extremities normal, atraumatic, no cyanosis or edema and Homans sign is negative, no sign of DVT    Assessment: POD # 1/ G2P1011/ S/P C/Section d/t elective Mild anemia Doing well  Plan: Ambulate Continue routine post op orders   Signed: Donette Larry, N, MSN, CNM 02/12/2014, 12:19 PM

## 2014-02-13 ENCOUNTER — Encounter (HOSPITAL_COMMUNITY): Payer: Self-pay | Admitting: Obstetrics and Gynecology

## 2014-02-13 NOTE — Lactation Note (Signed)
This note was copied from the chart of [name] [name]. Lactation Consultation Note Baby is latched on left side when I enter room. Mom states she is comfortable; denies breast, nipple pain. Baby slowing down, mom states he has been latched on for a few minutes already. Baby became very sleepy. Inst mom to burp baby and try to wake him to offer the other side. Baby did wake and latched well to the right side.  Enc mom to call for help if she has any concerns.   Patient Name: [name] [name] ZOXWR'U Date: 02/13/2014 Reason for consult: Follow-up assessment   Maternal Data    Feeding Feeding Type: Breast Fed Length of feed: 25 min  LATCH Score/Interventions Latch: Grasps breast easily, tongue down, lips flanged, rhythmical sucking.  Audible Swallowing: A few with stimulation  Type of Nipple: Everted at rest and after stimulation  Comfort (Breast/Nipple): Soft / non-tender     Hold (Positioning): No assistance needed to correctly position infant at breast.  LATCH Score: 9  Lactation Tools Discussed/Used     Consult Status Consult Status: PRN    Lenard Forth 02/13/2014, 3:31 PM

## 2014-02-13 NOTE — H&P (Signed)
NAMEAYAT, DRENNING              ACCOUNT NO.:  [id number]  MEDICAL RECORD NO.:  [id number]  LOCATION:                                 FACILITY:  PHYSICIAN:  Lenoard Aden, M.D.DATE OF BIRTH:  May 13, 1981  DATE OF ADMISSION:  02/11/2014 DATE OF DISCHARGE:                             HISTORY & PHYSICAL   CHIEF COMPLAINT:  Macrosomia with estimated fetal weight greater than 4500 g and polyhydramnios for primary C-section.  The patient declined vaginal delivery.  HISTORY OF PRESENT ILLNESS:  A 33 year old Asian female G1, P0 at 64 weeks' gestation, who presents with aforementioned indications for C- section.  The patient declined at this time for vaginal delivery.  She has been counseled about the inaccuracy of the ultrasound in predicting macrosomia.  The patient and husband expressed concerns regarding macrosomia, labor dystocia and shoulder dystocia.  They elected to proceed with primary cesarean section __________ recommendations for possible induction versus __________.  ALLERGIES:  She has no known drug allergies.  MEDICATIONS:  Prenatal vitamins __________.  FAMILY HISTORY:  Colon cancer, prostate cancer, brain cancer, heart disease, alcohol abuse, hypertension, and stroke.  SOCIAL HISTORY:  She is a nonsmoker, nondrinker.  She denies domestic or physical violence.  She has previous history of miscarriage for twin __________.  PHYSICAL EXAMINATION:  GENERAL:  She is a well-developed, well-nourished white female, in no acute distress. HEENT:  Normal. NECK:  Supple.  Full range of motion. LUNGS:  Clear. HEART:  Regular rate and rhythm. ABDOMEN:  Soft, nontender. PELVIC:  Reveals the uterus appropriate for gestational age, size __________ discrepancy.  Cervix is __________ vertex -3. EXTREMITIES:  There are no cords. NEUROLOGIC:  Nonfocal. SKIN:  Intact.  IMPRESSION:  A 40-week intrauterine pregnancy with presumed macrosomia and estimated fetal weight greater  than 4500 g and polyhydramnios for primary C-section.  PLAN:  To proceed with primary low segment transverse cesarean section. Risks of anesthesia, infection, bleeding, injury to surrounding organs, possible need for repair was discussed.  Delayed versus immediate complications to include bowel and bladder injury noted, inability to accurately predict __________ discussed.  The patient acknowledges and wishes to proceed.     Lenoard Aden, M.D.     RJT/MEDQ  D:  02/10/2014  T:  02/10/2014  Job:  [id number]

## 2014-02-13 NOTE — Progress Notes (Signed)
POD # 2  Subjective: Pt reports feeling well/ Pain controlled with Motrin and Percocet Tolerating po/ Voiding without problems/ No n/v/ Flatus present Activity: ad lib Bleeding is light Newborn info:  Information for the patient's newborn:  Lexia, Vandevender [[id number]]  female   / Circumcision: done / Feeding: breast   Objective:  VS:  Filed Vitals:   02/12/14 0800 02/12/14 1200 02/12/14 1832 02/13/14 0526  BP: 106/57 110/58 106/50 113/57  Pulse: 91 75 73 79  Temp: 97.9 F (36.6 C) 97.8 F (36.6 C) 97.7 F (36.5 C) 97.7 F (36.5 C)  TempSrc: Oral Oral Oral Oral  Resp: Weight:      SpO2: 99% 97% 98%      I&O: Intake/Output     09/20 0701 - 09/21 0700 09/21 0701 - 09/22 0700   I.V. (mL/kg)     Total Intake(mL/kg)     Urine (mL/kg/hr) 400 (0.2)    Blood     Total Output 400     Net -400             Recent Labs  02/10/14 1135 02/12/14 0548  WBC 14.8* 14.1*  HGB 12.9 10.5*  HCT 38.4 31.8*  PLT 215 177    Blood type: --/--/O POS (09/18 1135) Rubella:      Physical Exam:  General: alert and cooperative CV: Regular rate and rhythm Resp: CTA bilaterally Abdomen: soft, nontender, normal bowel sounds Incision: healing well, no drainage, no erythema, no hernia, no seroma, no swelling, well approximated, honeycomb dsg c/d/i Uterine Fundus: firm, below umbilicus, nontender Lochia: minimal Ext: extremities normal, atraumatic, no cyanosis or edema and Homans sign is negative, no sign of DVT    Assessment: POD # 2 / [id number] / S/P C/Section d/t elective Mild anemia  Doing well  Plan: Ambulate Continue routine post op orders Anticipate D/C home tomorrow  Signed: Kenard Gower, MSN, CNM 02/13/2014, 10:58 AM

## 2014-02-14 MED ORDER — IBUPROFEN 600 MG PO TABS: 600.0000 mg | ORAL_TABLET | Freq: Four times a day (QID) | ORAL | Status: AC

## 2014-02-14 MED ORDER — OXYCODONE-ACETAMINOPHEN 5-325 MG PO TABS: 1.0000 | ORAL_TABLET | ORAL | Status: DC | PRN

## 2014-02-14 NOTE — Lactation Note (Addendum)
This note was copied from the chart of [name] [name]. Lactation Consultation Note  Patient Name: [name] [name] RUEAV'W Date: 02/14/2014 Reason for consult: Follow-up assessment Per mom I have blisters on both nipples that are intact. LC assessed breast tissue with moms permission, Blisters were not overly apparent and skin color normal pink. LC did note areola edema and semi compressible areolas , breast are full bilaterally with nodules lateral aspects. LC reviewed sore nipple and engorgement prevention and tx , Lc recommended due to areola swelling a #27 flange would be indicated. LC reviewed basics - breast massage , hand express, prepump if needed and reverse pressure , demo to mom. Per mom has a DEBP Medela at home. Referring to the baby and me booklet. Mother informed of post-discharge support and given phone  number to the lactation department, including services for phone call assistance; out-patient appointments; and breastfeeding support group.  List of other breastfeeding resources in the community given in the handout. Encouraged mother to call for problems or concerns related to breastfeeding. Instructed on the use of a comfort gels , breast shells.   Maternal Data Formula Feeding for Exclusion: No Has patient been taught Hand Expression?: Yes  Feeding Feeding Type: Breast Fed Length of feed: 25 min  LATCH Score/Interventions Latch: Grasps breast easily, tongue down, lips flanged, rhythmical sucking. Intervention(s): Skin to skin  Audible Swallowing: A few with stimulation  Type of Nipple: Everted at rest and after stimulation  Comfort (Breast/Nipple): Filling, red/small blisters or bruises, mild/mod discomfort (sore nipples per mom )  Problem noted: Filling;Cracked, bleeding, blisters, bruises  Hold (Positioning): No assistance needed to correctly position infant at breast. Intervention(s): Breastfeeding basics reviewed (see LC note )  LATCH Score:  9  Lactation Tools Discussed/Used WIC Program: No   Consult Status Consult Status: Complete Date: 02/14/14    Kathrin Greathouse 02/14/2014, 11:26 AM

## 2014-02-14 NOTE — Discharge Summary (Signed)
POSTOPERATIVE DISCHARGE SUMMARY:  Patient ID: [name] [name] MRN: [id number] DOB/AGE: Jun 23, 1980 33 y.o.  Admit date: 02/11/2014 Admission Diagnoses: 40.1 weeks / macrosomia with EFW over 6500gm  / ployhydramnios  Discharge date:  02/14/2014 Discharge Diagnoses: POD 3 s/p primary cesarean section for macrosomia                                          mild ABL anemia-stable  Prenatal history: G2P1011   EDC : 02/10/2014, Alternate EDD Entry  Prenatal care at [facility]  Primary provider : Dr Ronita Hipps Prenatal course complicated by polyhydramnios / macrosomia / post-dates  Prenatal Labs: ABO, Rh: --/--/O POS (09/18 1135)  Antibody: NEG (09/18 1135) Rubella:   Immune RPR: NON REAC (09/18 1135)  HBsAg:   negative HIV:   NR GTT : NL  Medical / Surgical History :  Past medical history:  Past Medical History  Diagnosis Date  . Allergy   . Anxiety     travel  . Dysmenorrhea     Past surgical history:  Past Surgical History  Procedure Laterality Date  . Tonsilectomy, adenoidectomy, bilateral myringotomy and tubes    . Refractive surgery    . Eye surgery      bilateral lasik  . Dilation and evacuation N/A 12/07/2012    Procedure: DILATATION AND EVACUATION;  Surgeon: Marylynn Pearson, MD;  Location: [facility];  Service: Gynecology;  Laterality: N/A;  . Cesarean section N/A 02/11/2014    Procedure: Primary CESAREAN SECTION;  Surgeon: Lovenia Kim, MD;  Location: [facility];  Service: Obstetrics;  Laterality: N/A;  EDD: 02/10/14    Family History:  Family History  Problem Relation Age of Onset  . Alcohol abuse Mother   . Hypertension Father   . Diabetes Paternal Aunt   . Cancer Maternal Grandmother     brain  . Cancer Paternal Grandfather     Social History:  reports that she has never smoked. She has never used smokeless tobacco. She reports that she does not drink alcohol or use illicit drugs.  Allergies: Review of patient's allergies indicates no known  allergies.   Current Medications at time of admission:  Prior to Admission medications   Medication Sig Start Date End Date Taking? Authorizing Provider  Docosahexaenoic Acid (DHA PO) Take 1 tablet by mouth.   Yes Historical Provider, MD  Lidocaine-Hydrocortisone Ace (ANA-LEX) 2-2 % KIT Place 1 application rectally as needed.   Yes Historical Provider, MD  Prenatal Vit-Fe Fumarate-FA (PRENATAL MULTIVITAMIN) TABS tablet Take 1 tablet by mouth daily at 12 noon.   Yes Historical Provider, MD   Procedures: Cesarean section delivery on 02/11/2014 with delivery of female newborn by Dr Ronita Hipps   See operative report for further details APGAR (1 MIN): 9   APGAR (5 MINS): 9    Postoperative / postpartum course:  Uncomplicated with discharge on POD 3  Discharge Instructions: Discharged Condition: stable Activity: pelvic rest and postoperative restrictions x 2  Diet: routine  Medications:    Medication List         ANA-LEX 2-2 % Kit  Generic drug:  Lidocaine-Hydrocortisone Ace  Place 1 application rectally as needed.     DHA PO  Take 1 tablet by mouth.     ibuprofen 600 MG tablet  Commonly known as:  ADVIL,MOTRIN  Take 1 tablet (600 mg total) by mouth every 6 (six)  hours.     oxyCODONE-acetaminophen 5-325 MG per tablet  Commonly known as:  PERCOCET/ROXICET  Take 1 tablet by mouth every 4 (four) hours as needed (for pain scale less than 7).     prenatal multivitamin Tabs tablet  Take 1 tablet by mouth daily at 12 noon.            Recommend Magnesium 200-400mg  daily for constipation prevention       Smooth Move Tea  Wound Care: keep clean and dry / remove honeycomb POD 5 Postpartum Instructions: [facility] discharge booklet - instructions reviewed  Discharge to: Home  Follow up : [facility] in 6 weeks for routine postpartum visit with Dr Ronita Hipps                Signed: Artelia Laroche CNM, MSN, [facility] 02/14/2014, 9:19 AM

## 2014-02-14 NOTE — Progress Notes (Signed)
POSTOPERATIVE DAY # 3 S/P CS  S:         Reports feeling well - ready to go home             Tolerating po intake / no nausea / no vomiting / + flatus / no BM             Bleeding is light             Pain controlled with motrin and percocet             Up ad lib / ambulatory/ voiding QS  Newborn breast feeding - clustering / c/o nipple pain - frustration with feedings - awaiting lactation this am             Circumcision done  O:  VS: BP 111/56  Pulse 78  Temp(Src) 98.1 F (36.7 C) (Oral)  Resp 16  Wt 90.719 kg (200 lb)  SpO2 98%  LMP 05/06/2013  Breastfeeding? Unknown   LABS:               Recent Labs  02/12/14 0548  WBC 14.1*  HGB 10.5*  PLT 177               Bloodtype: --/--/O POS (09/18 1135)  Rubella:     Immune             tdaP 12/2013                                          Physical Exam:             Alert and Oriented X3  Lungs: Clear and unlabored  Heart: regular rate and rhythm / no mumurs  Abdomen: soft, non-tender, non-distended, active bowel sounds             Fundus: firm, non-tender, U-2             Dressing intact honeycomb              Incision:  approximated with suture / no erythema / no ecchymosis / no drainage  Perineum: intact  Lochia: light  Extremities: trace edema, no calf pain or tenderness  A:        POD # 3 S/P CS            Mild ABL anemia - continue PNV  P:        Routine postoperative care              DC home today             WOB booklet - instructions reviewed   Marlinda Mike CNM, MSN, [facility] 02/14/2014, 8:54 AM

## 2014-02-14 NOTE — Discharge Instructions (Signed)
Magnesium 200-400 mg daily for constipation prevention Warm steeped tea bags to nipple to resolve burning and cracked nipple Coconut oil to nipples

## 2014-03-27 ENCOUNTER — Encounter (HOSPITAL_COMMUNITY): Payer: Self-pay | Admitting: Obstetrics and Gynecology

## 2014-05-08 ENCOUNTER — Ambulatory Visit: Payer: BC Managed Care – PPO | Admitting: Family Medicine

## 2014-07-26 ENCOUNTER — Ambulatory Visit (INDEPENDENT_AMBULATORY_CARE_PROVIDER_SITE_OTHER): Payer: 59 | Admitting: Family Medicine

## 2014-07-26 ENCOUNTER — Encounter: Payer: Self-pay | Admitting: Family Medicine

## 2014-07-26 VITALS — BP 100/61 | HR 63 | Temp 98.0°F | Ht 65.0 in | Wt 180.0 lb

## 2014-07-26 DIAGNOSIS — H8110 Benign paroxysmal vertigo, unspecified ear: Secondary | ICD-10-CM

## 2014-07-26 DIAGNOSIS — H1133 Conjunctival hemorrhage, bilateral: Secondary | ICD-10-CM

## 2014-07-26 DIAGNOSIS — L03012 Cellulitis of left finger: Secondary | ICD-10-CM

## 2014-07-26 MED ORDER — FUROSEMIDE 20 MG PO TABS
20.0000 mg | ORAL_TABLET | Freq: Every day | ORAL | Status: AC
Start: 2014-07-26 — End: ?

## 2014-07-26 NOTE — Progress Notes (Signed)
   Subjective:    Patient ID: Marvel PlanKristi [name], female    DOB: 1980-08-10, 34 y.o.   MRN: [id number]  HPI Here for several issues. First she has had 5 episodes of subconjunctival hemorrhages involving both eyes in the past year, and she is getting over one now that started about 2 weeks ago. There is no eye pain or visual disturbance. She does not wear contacts. She takes no medication currently, not even OTC. She had a C section about 6 months ago and she breast fed for 2 months, but then stopped. She denies frequent sneezing or coughing. She just started exercising again one week ago with running and lifting light weights. No easy bruising. Second, she has a hx of ingrown toenails and had a portion of the left great toenail removed. Now in the past week after she started running again she has some pain along the edge of the left great toenail. Lastly she had taken Lasix daily to prevent occasional vertigo and it worked well, but she was told to stop this last year when she was trying to get pregnant. She wants to get on this again.    Review of Systems  Constitutional: Negative.   Eyes: Positive for redness. Negative for photophobia, pain, discharge, itching and visual disturbance.  Respiratory: Negative.   Endocrine: Negative.        Objective:   Physical Exam  Constitutional: She appears well-developed and well-nourished.  Eyes:  Right eye has some faint subconjunctival redness, left eye is clear.  Neck: No thyromegaly present.  Cardiovascular: Normal rate, regular rhythm, normal heart sounds and intact distal pulses.   Pulmonary/Chest: Effort normal and breath sounds normal.  Lymphadenopathy:    She has no cervical adenopathy.  Skin: Skin is warm and dry. No rash noted. No pallor.  No bruising seen. The lateral edge of skin along the left great toenail is red and a little tender           Assessment & Plan:  I don't have a good explanation for the subconjunctival bleeds, but we  will screen with some labs today. Recheck prn. She has a mild paronychia and she will wear loose fitting shoes and use hot Epsom salt soaks. I think her running has caused this, so I suggested other forms of exercising like biking or swimming. Get back on Lasix 20 mg daily.

## 2014-07-26 NOTE — Progress Notes (Signed)
Pre visit review using our clinic review tool, if applicable. No additional management support is needed unless otherwise documented below in the visit note. 

## 2014-07-28 ENCOUNTER — Other Ambulatory Visit (INDEPENDENT_AMBULATORY_CARE_PROVIDER_SITE_OTHER): Payer: 59

## 2014-07-28 DIAGNOSIS — H1133 Conjunctival hemorrhage, bilateral: Secondary | ICD-10-CM

## 2014-07-28 LAB — CBC WITH DIFFERENTIAL/PLATELET
BASOS ABS: 0 10*3/uL (ref 0.0–0.1)
Basophils Relative: 0.4 % (ref 0.0–3.0)
EOS PCT: 2 % (ref 0.0–5.0)
Eosinophils Absolute: 0.2 10*3/uL (ref 0.0–0.7)
HEMATOCRIT: 39.7 % (ref 36.0–46.0)
HEMOGLOBIN: 13.4 g/dL (ref 12.0–15.0)
LYMPHS ABS: 2.6 10*3/uL (ref 0.7–4.0)
LYMPHS PCT: 29.5 % (ref 12.0–46.0)
MCHC: 33.7 g/dL (ref 30.0–36.0)
MCV: 87.2 fl (ref 78.0–100.0)
MONO ABS: 0.5 10*3/uL (ref 0.1–1.0)
Monocytes Relative: 6 % (ref 3.0–12.0)
Neutro Abs: 5.5 10*3/uL (ref 1.4–7.7)
Neutrophils Relative %: 62.1 % (ref 43.0–77.0)
Platelets: 297 10*3/uL (ref 150.0–400.0)
RBC: 4.55 Mil/uL (ref 3.87–5.11)
RDW: 14 % (ref 11.5–15.5)
WBC: 8.9 10*3/uL (ref 4.0–10.5)

## 2014-07-28 LAB — BASIC METABOLIC PANEL
BUN: 18 mg/dL (ref 6–23)
CHLORIDE: 104 meq/L (ref 96–112)
CO2: 25 mEq/L (ref 19–32)
Calcium: 9.5 mg/dL (ref 8.4–10.5)
Creatinine, Ser: 0.89 mg/dL (ref 0.40–1.20)
GFR: 77.54 mL/min (ref >60.00)
GLUCOSE: 94 mg/dL (ref 70–99)
POTASSIUM: 3.9 meq/L (ref 3.5–5.1)
SODIUM: 136 meq/L (ref 135–145)

## 2014-07-28 LAB — HEPATIC FUNCTION PANEL
ALBUMIN: 4.6 g/dL (ref 3.5–5.2)
ALK PHOS: 85 U/L (ref 39–117)
ALT: 19 U/L (ref 0–35)
AST: 19 U/L (ref 0–37)
BILIRUBIN TOTAL: 0.4 mg/dL (ref 0.2–1.2)
Bilirubin, Direct: 0.1 mg/dL (ref 0.0–0.3)
Total Protein: 7.2 g/dL (ref 6.0–8.3)

## 2014-07-28 LAB — TSH: TSH: 1.37 u[IU]/mL (ref 0.35–4.50)

## 2014-08-03 ENCOUNTER — Ambulatory Visit (INDEPENDENT_AMBULATORY_CARE_PROVIDER_SITE_OTHER): Payer: 59 | Admitting: Family Medicine

## 2014-08-03 ENCOUNTER — Encounter: Payer: Self-pay | Admitting: Family Medicine

## 2014-08-03 VITALS — BP 95/63 | HR 71 | Temp 99.2°F | Ht 65.0 in | Wt 176.0 lb

## 2014-08-03 DIAGNOSIS — M542 Cervicalgia: Secondary | ICD-10-CM

## 2014-08-03 NOTE — Progress Notes (Signed)
   Subjective:    Patient ID: [name] [name], female    DOB: [birth year], 34 y.o.   MRN: [id number]  HPI Here for one month of intermittent discomfort in the anterior throat area. She points to the Adams apple as the source of the pain. It bothers her little to swallow but food passes easily. No internal sore throat. No HA or cough or fever. She is running and lifting weights as usual.    Review of Systems  Constitutional: Negative.   HENT: Negative.   Respiratory: Negative.        Objective:   Physical Exam  Constitutional: She appears well-developed and well-nourished.  HENT:  Right Ear: External ear normal.  Left Ear: External ear normal.  Nose: Nose normal.  Mouth/Throat: Oropharynx is clear and moist. No oropharyngeal exudate.  Neck: Normal range of motion. Neck supple. No tracheal deviation present. No thyromegaly present.  She is slightly tender just over the anterior notch of the thyroid cartilage, no masses or swelling.   Pulmonary/Chest: Effort normal and breath sounds normal. No stridor.  Lymphadenopathy:    She has no cervical adenopathy.          Assessment & Plan:  It is not clear what the etiology of this discomfort could be, but this is causing her a lot of anxiety so we will refer to ENT to evaluate further

## 2014-08-03 NOTE — Progress Notes (Signed)
Pre visit review using our clinic review tool, if applicable. No additional management support is needed unless otherwise documented below in the visit note. 

## 2018-03-18 LAB — LIPID PANEL
Chol/HDL Ratio: 2.3 (ref 0.0–4.4)
Cholesterol: 213 mg/dL — ABNORMAL HIGH (ref 100–200)
HDL: 91 mg/dL (ref 65–96)
LDL Cholesterol: 102.2 mg/dL — ABNORMAL HIGH (ref 0.0–100.0)
LDL/HDL Ratio: 1.1
Triglycerides: 99 mg/dL (ref 0–149)
VLDL: 19.8 mg/dL (ref 5.0–40.0)

## 2018-03-18 LAB — COMPREHENSIVE METABOLIC PANEL
ALT: 20 U/L (ref 0–33)
AST: 25 U/L (ref 0–32)
Albumin/Globulin Ratio: 1.7 mmol/L (ref 1.00–2.00)
Albumin: 4.4 g/dL (ref 3.5–5.2)
Alk Phosphatase: 47 U/L (ref 35–117)
Anion Gap: 10 mmol/L (ref 2–17)
BUN: 9 mg/dL (ref 6–20)
CO2: 25 mmol/L (ref 22–29)
Calcium: 9.1 mg/dL (ref 8.6–10.0)
Chloride: 103 mmol/L (ref 98–107)
Creatinine: 0.7 mg/dL (ref 0.5–0.9)
GFR African American: 129 mL/min/{1.73_m2} (ref >=90)
GFR Non-African American: 111 mL/min/{1.73_m2} (ref >=90)
Globulin: 3 g/dL (ref 1.9–4.4)
Glucose: 89 mg/dL (ref 70–99)
OSMOLALITY CALCULATED: 274 mOsm/kg (ref 270–287)
Potassium: 4.6 mmol/L (ref 3.5–5.3)
Sodium: 138 mmol/L (ref 135–145)
Total Bilirubin: 0.3 mg/dL (ref 0.00–1.20)
Total Protein: 7 g/dL (ref 6.4–8.3)

## 2018-03-18 LAB — CBC WITH AUTO DIFFERENTIAL
Absolute Eos #: 0.2 10*3/uL (ref 0.0–0.5)
Absolute Lymph #: 2.3 10*3/uL (ref 1.0–3.2)
Absolute Mono #: 0.6 10*3/uL (ref 0.3–1.0)
Basophils %: 0.4 % (ref 0.0–2.0)
Eosinophils %: 1.6 % (ref 0.0–7.0)
Hematocrit: 37.7 % (ref 34.0–47.0)
Hemoglobin: 12.6 g/dL (ref 11.5–15.7)
Lymphocytes: 23 % (ref 15.0–45.0)
MCH: 28.8 pg (ref 27.0–34.5)
MCHC: 33.5 g/dL (ref 32.0–36.0)
MCV: 86 fL (ref 81.0–99.0)
MPV: 9.2 fL (ref 7.2–13.2)
Monocytes: 6.2 % (ref 4.0–12.0)
Neutrophils %: 68.8 % (ref 42.0–74.0)
Neutrophils Absolute: 6.8 10*3/uL (ref 1.6–7.3)
Platelets: 298 10*3/uL (ref 140–440)
RBC: 4.38 x10e6/mcL (ref 3.60–5.20)
RDW: 14.4 % (ref 11.0–16.0)
WBC: 9.9 10*3/uL (ref 3.8–10.6)

## 2018-03-18 LAB — TSH WITH REFLEX TO FT4: TSH: 2.58 mcIU/mL (ref 0.358–3.740)

## 2018-06-05 LAB — BASIC METABOLIC PANEL
Anion Gap: 16 mmol/L (ref 2–17)
BUN: 13 mg/dL (ref 6–20)
CO2: 22 mmol/L (ref 22–29)
Calcium: 8.8 mg/dL (ref 8.6–10.0)
Chloride: 97 mmol/L — ABNORMAL LOW (ref 98–107)
Creatinine: 0.7 mg/dL (ref 0.5–0.9)
GFR African American: 128 mL/min/{1.73_m2} (ref >=90)
GFR Non-African American: 111 mL/min/{1.73_m2} (ref >=90)
Glucose: 112 mg/dL — ABNORMAL HIGH (ref 70–99)
OSMOLALITY CALCULATED: 271 mOsm/kg (ref 270–287)
Potassium: 3.8 mmol/L (ref 3.5–5.3)
Sodium: 135 mmol/L (ref 135–145)

## 2018-06-05 LAB — POC URINALYSIS, CHEMISTRY
Bilirubin, Urine, POC: NEGATIVE
Blood, UA POC: NEGATIVE
Glucose, UA POC: NEGATIVE mg/dL
Ketones, Urine, POC: 40 mg/dL — AB
Nitrate, UA POC: NEGATIVE
Specific Gravity, Urine, POC: 1.015 (ref 1.003–1.035)
UROBILIN U POC: 0.2 EU/dL
pH, Urine, POC: 7.5 (ref 4.5–8.0)

## 2018-06-05 LAB — CBC WITH AUTO DIFFERENTIAL
Absolute Baso #: 0.1 10*3/uL (ref 0.0–0.2)
Absolute Eos #: 0.1 10*3/uL (ref 0.0–0.5)
Absolute Lymph #: 0.7 10*3/uL — ABNORMAL LOW (ref 1.0–3.2)
Absolute Mono #: 0.9 10*3/uL (ref 0.3–1.0)
Basophils %: 0.3 % (ref 0.0–2.0)
Eosinophils %: 0.3 % (ref 0.0–7.0)
Hematocrit: 39.3 % (ref 38.0–47.0)
Hemoglobin: 12.7 g/dL (ref 11.5–15.7)
Immature Grans (Abs): 0.3 10*3/uL
Immature Granulocytes: 1 %
Lymphocytes: 2.4 % — ABNORMAL LOW (ref 15.0–45.0)
MCH: 27.8 pg (ref 27.0–34.5)
MCHC: 32.3 g/dL (ref 32.0–36.0)
MCV: 86 fL (ref 81.0–99.0)
MPV: 10.5 fL (ref 7.2–13.2)
Monocytes: 2.8 % — ABNORMAL LOW (ref 4.0–12.0)
NRBC Absolute: 0 10*3/uL
NRBC Automated: 0 %
Neutrophils %: 93.2 % — ABNORMAL HIGH (ref 42.0–74.0)
Neutrophils Absolute: 28.3 10*3/uL — ABNORMAL HIGH (ref 1.6–7.3)
Platelets: 302 10*3/uL (ref 140–440)
RBC: 4.57 x10e6/mcL (ref 3.60–5.20)
RDW: 14 % (ref 11.0–16.0)
WBC: 30.4 10*3/uL — ABNORMAL HIGH (ref 3.8–10.6)

## 2018-06-05 LAB — RESPIRATORY PANEL, MOLECULAR
Adenovirus: NOT DETECTED
Bordetella Pertussis: NOT DETECTED
CORONAVIRUS 229E: NOT DETECTED
CORONAVIRUS HKU1: NOT DETECTED
CORONAVIRUS NL63: NOT DETECTED
CORONAVIRUS OC43: NOT DETECTED
Chlamydia Pneumoniae: NOT DETECTED
Human Metapneumovirus: NOT DETECTED
Human Rhinovirus/Enterovirus: NOT DETECTED
INFLUENZA A: NOT DETECTED
INFLUENZA B: NOT DETECTED
MYCOPLASMA PNEUMONIAE: NOT DETECTED
PARAINFLUENZA 4: NOT DETECTED
Parainfluenza 1: NOT DETECTED
Parainfluenza 2: NOT DETECTED
Parainfluenza 3: NOT DETECTED
Respiratory Syncytial Virus: NOT DETECTED

## 2018-06-05 LAB — PROCALCITONIN: Procalcitonin: 0.22 ng/mL (ref <=0.25)

## 2018-06-05 NOTE — ED Notes (Signed)
ED Triage Note       ED Triage Adult Entered On:  06/05/2018 15:38 EST    Performed On:  06/05/2018 15:33 EST by Ladona Ridgel, RN, JENNIFER S               Triage   Chief Complaint :   pt c/o body aches, general weakness, nausea starting this am. sent from doctor's care for further eval after cbc showed high WBC of 29. given zofran at doc care   Tamora, RN, Richarda Osmond - 06/05/2018 15:38 EST     Numeric Rating Pain Scale :   0 = No pain   [place] Mode of Arrival :   Private vehicle   Temperature Oral :   37.9 degC(Converted to: 100.2 degF)  (HI)    Heart Rate Monitored :   110 bpm (HI)    Respiratory Rate :   18 br/min   Systolic Blood Pressure :   112 mmHg   Diastolic Blood Pressure :   64 mmHg   SpO2 :   98 %   Oxygen Therapy :   Room air   Patient presentation :   HR > 100   Chief Complaint or Presentation suggest infection :   Yes   Weight Dosing :   77 kg(Converted to: 169 lb 12 oz)    Height :   165 cm(Converted to: 5 ft 5 in)    Body Mass Index Dosing :   28 kg/m2   ED General Section :   Document assessment   Pregnancy Status :   Patient denies   Laruth Bouchard - 06/05/2018 15:33 EST   DCP GENERIC CODE   Tracking Group :   ED [facility] Main Tracking Group   Tracking Acuity :   3   Epifania Gore S - 06/05/2018 15:33 EST   ED Allergies Section :   Document assessment   ED Reason for Visit Section :   Document assessment   Ladona Ridgel RN, Richarda Osmond - 06/05/2018 15:33 EST   Allergies   (As Of: 06/05/2018 15:38:09 EST)   Allergies (Active)   No Known Allergies  Estimated Onset Date:   Unspecified ; Created By:   Ladona Ridgel, RN, Victorino Dike S; Reaction Status:   Active ; Category:   Drug ; Substance:   No Known Allergies ; Type:   Allergy ; Updated By:   Laruth Bouchard; Reviewed Date:   06/05/2018 15:36 EST        Psycho-Social   Last 3 mo, thoughts killing self/others :   Patient denies   Laruth Bouchard - 06/05/2018 15:33 EST   ED Reason for Visit   (As Of: 06/05/2018 15:38:09 EST)   Diagnoses(Active)    Body aches   Date:   06/05/2018 ; Diagnosis Type:   Reason For Visit ; Confirmation:   Complaint of ; Clinical Dx:   Body aches ; Classification:   Medical ; Clinical Service:   Emergency medicine ; Code:   PNED ; Probability:   0 ; Diagnosis Code:   [id number]      Nausea  Date:   06/05/2018 ; Diagnosis Type:   Reason For Visit ; Confirmation:   Complaint of ; Clinical Dx:   Nausea ; Classification:   Medical ; Clinical Service:   Emergency medicine ; Code:   PNED ; Probability:   0 ; Diagnosis Code:   [id number]

## 2018-06-05 NOTE — ED Notes (Signed)
 ED Patient Education Note     Patient Education Materials Follows:  Easy-to-Read          Viral Infection  A viral infection is an illness that affects various parts of the body  It is caused by a germ called a virus.  Some examples of this kind of infection are:    . A cold.  . The flu (influenza).  . A respiratory syncytial virus (RSV) infection.        Gastroenteritis        Pharyngitis  HOW DO I KNOW IF I HAVE THIS INFECTION?  Most of the time this infection causes:  . A stuffy or runny nose.  . Yellow or green fluid in the nose.  . A cough.  . Sneezing.  . Tiredness (fatigue).  . Achy muscles.  . A sore throat.  . Sweating or chills.  . A fever.  . A headache.        Nausea, vomiting, or diarrhea  HOW IS THIS INFECTION TREATED?  If the flu is diagnosed early, it may be treated with an antiviral medicine. This medicine shortens the length of time a person has symptoms. Symptoms may be treated with over-the-counter and prescription medicines, such as:  SABRA Expectorants. These make it easier to cough up mucus.  . Decongestant nasal sprays.  Doctors do not prescribe antibiotic medicines for viral infections. They do not work with this kind of infection.  HOW DO I KNOW IF I SHOULD STAY HOME?  To keep others from getting sick, stay home if you have:  . A fever.  . A lasting cough.  . A sore throat.  . A runny nose.  . Sneezing.   . Muscles aches.  . Headaches.  . Tiredness.  . Weakness.  . Chills.  . Sweating.  . An upset stomach (nausea).  HOME CARE  . Rest as much as possible.  . Take over-the-counter and prescription medicines only as told by your doctor.  . Drink enough fluid to keep your pee (urine) clear or pale yellow.  . If the throat is sore gargle with salt water. Do this 3-4 times per day or as needed. To make a salt-water mixture, dissolve -1 tsp of salt in 1 cup of warm water. Make sure the salt dissolves all the way.  . Use nose drops made from salt water. This helps with stuffiness (congestion). It also  helps soften the skin around your nose.  . Do not drink alcohol.  . Do not use tobacco products, including cigarettes, chewing tobacco, and e-cigarettes. If you need help quitting, ask your doctor.  GET HELP IF:  . Your symptoms last for 10 days or longer.  . Your symptoms get worse over time.  . You have a fever.  . You have very bad pain in your face or forehead.  . Parts of your jaw or neck become very swollen.  GET HELP RIGHT AWAY IF:  . You feel pain or pressure in your chest.  . You have shortness of breath.  . You faint or feel like you will faint.  . You keep throwing up (vomiting).  . You feel confused.  This information is not intended to replace advice given to you by your health care provider. Make sure you discuss any questions you have with your health care provider.  Document Released: 04/24/2008 Document Revised: 04/23/2015 Document Reviewed: 10/18/2014  Elsevier Interactive Patient Education [employer]

## 2018-06-05 NOTE — ED Notes (Signed)
ED Triage Note       ED Secondary Triage Entered On:  06/05/2018 16:16 EST    Performed On:  06/05/2018 16:14 EST by Quincy Sheehan, RN, Reche Dixon               General Information   Barriers to Learning :   None evident   ED Home Meds Section :   Document assessment   [facility] ED Fall Risk Section :   Document assessment   Infectious Disease Documentation :   Document assessment   ED Advance Directives Section :   Document assessment   Quincy Sheehan RNReche Dixon - 06/05/2018 16:14 EST   (As Of: 06/05/2018 16:16:05 EST)   Diagnoses(Active)    Body aches  Date:   06/05/2018 ; Diagnosis Type:   Reason For Visit ; Confirmation:   Complaint of ; Clinical Dx:   Body aches ; Classification:   Medical ; Clinical Service:   Emergency medicine ; Code:   PNED ; Probability:   0 ; Diagnosis Code:   [phone]      Nausea  Date:   06/05/2018 ; Diagnosis Type:   Reason For Visit ; Confirmation:   Complaint of ; Clinical Dx:   Nausea ; Classification:   Medical ; Clinical Service:   Emergency medicine ; Code:   PNED ; Probability:   0 ; Diagnosis Code:   [id number]             -    Procedure History   (As Of: 06/05/2018 16:16:05 EST)     Phoebe Perch Fall Risk Assessment Tool   Hx of falling last 3 months ED Fall :   No   Patient confused or disoriented ED Fall :   No   Patient intoxicated or sedated ED Fall :   No   Patient impaired gait ED Fall :   No   Use a mobility assistance device ED Fall :   No   Patient altered elimination ED Fall :   No   [facility] ED Fall Score :   0    Kimsel, RN, Reche Dixon - 06/05/2018 16:14 EST   ED Advance Directive   Advance Directive :   No   Kimsel, RN, Reche Dixon - 06/05/2018 16:14 EST   ID Risk Screen Symptoms   Recent Travel History :   No recent travel   TB Symptom Screen :   No symptoms   C. diff Symptom/History ID :   Neither of the above   Kimsel, RN, Reche Dixon - 06/05/2018 16:14 EST   Med Hx   Medication List   (As Of: 06/05/2018 16:16:05 EST)   Normal Order    ketorolac 30  mg/mL Inj Soln 1 mL  :   ketorolac 30 mg/mL Inj Soln 1 mL ; Status:   Completed ; Ordered As Mnemonic:   Toradol ; Simple Display Line:   15 mg, 0.5 mL, IV Push, Once ; Ordering Provider:   Wenda Overland, RONALD C; Catalog Code:   ketorolac ; Order Dt/Tm:   06/05/2018 15:53:27 EST          Sodium Chloride 0.9% intravenous solution Bolus  :   Sodium Chloride 0.9% intravenous solution Bolus ; Status:   Completed ; Ordered As Mnemonic:   Sodium Chloride 0.9% bolus ; Simple Display Line:   1,000 mL, 1000 mL/hr, IV Piggyback, Once ; Ordering Provider:   Wenda Overland, RONALD C; Catalog Code:   Sodium Chloride 0.9% ; Order  Dt/Tm:   06/05/2018 15:53:26 EST            Home Meds    furosemide  :   furosemide ; Status:   Documented ; Ordered As Mnemonic:   furosemide 20 mg oral tablet ; Simple Display Line:   TAKE 1 TABLET BY MOUTH EVERY DAY IF NEEDED ; Ordering Provider:   Alesia Banda; Catalog Code:   furosemide ; Order Dt/Tm:   06/05/2018 16:15:34 EST          norgestimate-ethinyl estradiol  :   norgestimate-ethinyl estradiol ; Status:   Documented ; Ordered As Mnemonic:   Sprintec 0.25 mg-35 mcg oral tablet ; Simple Display Line:   TAKE 1 TABLET BY MOUTH EVERY DAY ; Ordering Provider:   Fenton Foy; Catalog Code:   norgestimate-ethinyl estradiol ; Order Dt/Tm:   06/05/2018 16:15:34 EST

## 2018-06-05 NOTE — ED Provider Notes (Signed)
Body aches        Patient:   Lauren [name], Lauren [name]             MRN: [id number]            FIN: [id number]               Age:   38 years     Sex:  Female     DOB:  1981-01-10   Associated Diagnoses:   Viral syndrome   Author:   Samuel Bouche C      Basic Information   History source: Patient.   Arrival mode: Private vehicle.   History limitation: None.      History of Present Illness   Very well-appearing 38 year old female presents to the emergency department complaining of relatively sudden onset of body aches and nausea that began approximately 5 to 6 hours ago.  Patient states that when she initially got up this morning she felt quite well took her morning medication.  An hour or so afterwards her symptoms began.  She states that she laid down for a little while tried to rest but her symptoms persisted so she presented to a local urgent care.  She was given a dose of Zofran IM which has improved her nausea.  She also had blood drawn and had a CBC obtained which showed an elevated white blood cell count.  She was subsequently sent to the emergency department for evaluation.  She denies any specific complaints of pain outside of aching throughout her entire body and joints.  She denies any cough or shortness of breath.      Review of Systems             Additional review of systems information: All systems reviewed as documented in chart.      Health Status   Allergies:    Allergic Reactions (Selected)  No Known Allergies.      Past Medical/ Family/ Social History   Past medical/Family/Social history reviewed in patients medical record.       Physical Examination               Vital Signs               Per nurse's notes.   General:  Alert, no acute distress.    Skin:  Warm, dry, pink.    Head:  Normocephalic.   Neck:  Supple.   Ears, nose, mouth and throat:  Oral mucosa moist.   Cardiovascular:  Regular rate and rhythm, Normal peripheral perfusion.    Respiratory:  Lungs are clear to auscultation, respirations are  non-labored, breath sounds are equal.    Gastrointestinal:  Soft, Nontender, Non distended, Normal bowel sounds.    Musculoskeletal:  No tenderness, no swelling.    Neurological:  Alert and oriented to person, place, time, and situation, No focal neurological deficit observed.    Lymphatics:  No lymphadenopathy.   Psychiatric:  Cooperative, appropriate mood & affect.       Medical Decision Making   Documents reviewed:  Emergency department nurses' notes.   Notes:  Normal physical exam except for mild tachycardia.  Improved symptoms here in the department with fluids.  Negative procalcitonin despite elevated white blood cell count.  Likely demargination from stress response.  Discussed with patient the potential for influenza given her symptoms and onset.  Also discussed the need for close monitoring of her symptoms going forward and to return for any evidence of worsening.  Cultures obtained and are pending..      Impression and Plan   Diagnosis   Viral syndrome (ICD10-CM B34.9, Discharge, Medical)   Plan   Condition: Improved, Stable.    Disposition: Discharged: Time  06/05/2018 17:47:00, to home.    Prescriptions: Launch prescriptions   Pharmacy:  Zofran ODT 4 mg oral tablet, disintegrating (Prescribe): 4 mg, 1 tabs, Oral, q8hr, PRN: nausea, 12 tabs, 0 Refill(s)  Tamiflu 75 mg oral capsule (Prescribe): 75 mg, 1 caps, Oral, BID, for 5 days, 10 caps, 0 Refill(s).    Patient was given the following educational materials: Viral Respiratory Infection, Easy-To-Read.    Follow up with: Follow up with primary care provider Within 1 week; Tylenol/motrin as needed for fever/ bodyaches.    Counseled: Patient, Regarding diagnosis, Regarding diagnostic results, Regarding treatment plan, Regarding prescription, Patient indicated understanding of instructions.    Notes: I had a detailed discussion with the patient regarding today's presentation and evaluation and physical exam findings. I explained to the patient the diagnosis  based on these factors. I explained the likely time frame and expected course. I gave detailed instructions on signs and symptoms to be monitored, with precautions to return for any new, unexpected or worsening symptoms.[employer]ed forces training and education officer Signed on 06/05/2018 05:59 PM EST   ________________________________________________   Wenda Overland, Josel Keo C               Modified by: Wenda Overland, Haliegh Khurana C on 06/05/2018 05:48 PM EST      Modified by: Wenda Overland, Antonae Zbikowski C on 06/05/2018 05:58 PM EST      Modified by: Wenda Overland, Carmell Elgin C on 06/05/2018 05:59 PM EST

## 2018-06-05 NOTE — Discharge Summary (Signed)
ED Clinical Summary                        [facility]  [address]  [place], [place] [id number]  [phone]           PERSON INFORMATION  Name: Lauren [name], Lauren [name] Age:  89 Years DOB: 1980/09/10   Sex: Female Language: English PCP: PCP,  NONE   Marital Status: Married Phone: [phone] Med Service: MED-Medicine   MRN: [id number] Acct# [id number] Arrival: 06/05/2018 15:26:00   Visit Reason: Body aches; Nausea; LOW WHITE BLOOD  CELLS Acuity: 3 LOS: 000 02:49   Address:    [address] [place] [id number]   Diagnosis:    Viral syndrome  Medications:          New Medications  Printed Prescriptions  ondansetron (Zofran ODT 4 mg oral tablet, disintegrating) 1 Tabs Oral (given by mouth) every 8 hours as needed nausea. Refills: 0.  Last Dose:____________________  oseltamivir (Tamiflu 75 mg oral capsule) 1 Capsules Oral (given by mouth) 2 times a day for 5 Days. Refills: 0.  Last Dose:____________________  Medications that have not changed  Other Medications  furosemide (furosemide 20 mg oral tablet) TAKE 1 TABLET BY MOUTH EVERY DAY IF NEEDED.  Last Dose:____________________  norgestimate-ethinyl estradiol (Sprintec 0.25 mg-35 mcg oral tablet) TAKE 1 TABLET BY MOUTH EVERY DAY.  Last Dose:____________________      Medications Administered During Visit:                Medication Dose Route   Sodium Chloride 0.9% 1000 mL IV Piggyback   ketorolac 15 mg IV Push               Allergies      No Known Allergies      Major Tests and Procedures:  The following procedures and tests were performed during your ED visit.  COMMON PROCEDURES%>  COMMON PROCEDURES COMMENTS%>                PROVIDER INFORMATION               Provider Role Assigned Carma LeavenUnassigned   TURNER-MD, JR, RONALD C ED Provider 06/05/2018 15:39:15    Kimsel, RN, Reche DixonSavannah M ED Nurse 06/05/2018 15:44:12        Attending Physician:  Wenda OverlandURNER-MD, JR, RONALD C      Admit Doc  TURNER-MD, JR, RONALD C     Consulting Doc       VITALS INFORMATION  Vital Sign Triage Latest    Temp Oral ORAL_1%> ORAL%>   Temp Temporal TEMPORAL_1%> TEMPORAL%>   Temp Intravascular INTRAVASCULAR_1%> INTRAVASCULAR%>   Temp Axillary AXILLARY_1%> AXILLARY%>   Temp Rectal RECTAL_1%> RECTAL%>   02 Sat 98 % 98 %   Respiratory Rate RATE_1%> RATE%>   Peripheral Pulse Rate PULSE RATE_1%> PULSE RATE%>   Apical Heart Rate HEART RATE_1%> HEART RATE%>   Blood Pressure BLOOD PRESSURE_1%>/ BLOOD PRESSURE_1%>64 mmHg BLOOD PRESSURE%> / BLOOD PRESSURE%>70 mmHg                 Immunizations      No Immunizations Documented This Visit          DISCHARGE INFORMATION   Discharge Disposition: H Outpt-Sent Home   Discharge Location:  Home   Discharge Date and Time:  06/05/2018 18:15:22   ED Checkout Date and Time:  06/05/2018 18:15:22     DEPART REASON INCOMPLETE INFORMATION  Depart Action Incomplete Reason   Interactive View/I&O Recently assessed               Problems      No Problems Documented              Smoking Status      No Smoking Status Documented         PATIENT EDUCATION INFORMATION  Instructions:     Viral Respiratory Infection, Easy-To-Read     Follow up:                   With: Address: When:   Tylenol/motrin as needed for fever/ bodyaches         With: Address: When:   Follow up with primary care provider  Within 1 week              ED PROVIDER DOCUMENTATION     Patient:   Lauren [name], Lauren [name]             MRN: [id number]            FIN: [id number]               Age:   38 years     Sex:  Female     DOB:  June 12, 1980   Associated Diagnoses:   Viral syndrome   Author:   Samuel Bouche C      Basic Information   History source: Patient.   Arrival mode: Private vehicle.   History limitation: None.      History of Present Illness   Very well-appearing 38 year old female presents to the emergency department complaining of relatively sudden onset of body aches and nausea that began approximately 5 to 6 hours ago.  Patient states that when she initially got up this morning she felt quite well took her morning  medication.  An hour or so afterwards her symptoms began.  She states that she laid down for a little while tried to rest but her symptoms persisted so she presented to a local urgent care.  She was given a dose of Zofran IM which has improved her nausea.  She also had blood drawn and had a CBC obtained which showed an elevated white blood cell count.  She was subsequently sent to the emergency department for evaluation.  She denies any specific complaints of pain outside of aching throughout her entire body and joints.  She denies any cough or shortness of breath.      Review of Systems             Additional review of systems information: All systems reviewed as documented in chart.      Health Status   Allergies:    Allergic Reactions (Selected)  No Known Allergies.      Past Medical/ Family/ Social History   Past medical/Family/Social history reviewed in patients medical record.       Physical Examination               Vital Signs               Per nurse's notes.   General:  Alert, no acute distress.    Skin:  Warm, dry, pink.    Head:  Normocephalic.   Neck:  Supple.   Ears, nose, mouth and throat:  Oral mucosa moist.   Cardiovascular:  Regular rate and rhythm, Normal peripheral perfusion.    Respiratory:  Lungs are clear to auscultation, respirations are non-labored, breath  sounds are equal.    Gastrointestinal:  Soft, Nontender, Non distended, Normal bowel sounds.    Musculoskeletal:  No tenderness, no swelling.    Neurological:  Alert and oriented to person, place, time, and situation, No focal neurological deficit observed.    Lymphatics:  No lymphadenopathy.   Psychiatric:  Cooperative, appropriate mood & affect.       Medical Decision Making   Documents reviewed:  Emergency department nurses' notes.   Notes:  Normal physical exam except for mild tachycardia.  Improved symptoms here in the department with fluids.  Negative procalcitonin despite elevated white blood cell count.  Likely demargination from  stress response.  Discussed with patient the potential for influenza given her symptoms and onset.  Also discussed the need for close monitoring of her symptoms going forward and to return for any evidence of worsening.  Cultures obtained and are pending..      Impression and Plan   Diagnosis   Viral syndrome (ICD10-CM B34.9, Discharge, Medical)   Plan   Condition: Improved, Stable.    Disposition: Discharged: Time  06/05/2018 17:47:00, to home.    Prescriptions: Launch prescriptions   Pharmacy:  Zofran ODT 4 mg oral tablet, disintegrating (Prescribe): 4 mg, 1 tabs, Oral, q8hr, PRN: nausea, 12 tabs, 0 Refill(s)  Tamiflu 75 mg oral capsule (Prescribe): 75 mg, 1 caps, Oral, BID, for 5 days, 10 caps, 0 Refill(s).    Patient was given the following educational materials: Viral Respiratory Infection, Easy-To-Read.    Follow up with: Follow up with primary care provider Within 1 week; Tylenol/motrin as needed for fever/ bodyaches.    Counseled: Patient, Regarding diagnosis, Regarding diagnostic results, Regarding treatment plan, Regarding prescription, Patient indicated understanding of instructions.    Notes: I had a detailed discussion with the patient regarding today's presentation and evaluation and physical exam findings. I explained to the patient the diagnosis based on these factors. I explained the likely time frame and expected course. I gave detailed instructions on signs and symptoms to be monitored, with precautions to return for any new, unexpected or worsening symptoms.Marland Kitchen

## 2018-06-05 NOTE — Nursing Note (Signed)
Medication Administration Follow Up-Text       Medication Administration Follow Up Entered On:  06/05/2018 16:30 EST    Performed On:  06/05/2018 16:30 EST by Quincy Sheehan, RN, Reche Dixon      Intervention Information:     ketorolac  Performed by Quincy Sheehan RN, Reche Dixon on 06/05/2018 16:06:00 EST       ketorolac,15mg   IV Push,Antecubital, Left       Med Response   ED Medication Response :   No adverse reaction, Symptoms improved   Quincy Sheehan, RN, Reche Dixon - 06/05/2018 16:30 EST

## 2018-06-05 NOTE — ED Notes (Signed)
 ED Patient Summary             [facility] Emergency Department  [address], [place], [place] [address]  [phone]  Discharge Instructions (Patient)  _______________________________________    Name: Lauren [name], Lauren [name]  DOB: 07-Feb-1981 MRN: [id number] FIN: [id number]  Reason For Visit: Body aches; Nausea; LOW WHITE BLOOD  CELLS  Final Diagnosis: Viral syndrome    Visit Date: 06/05/2018 15:26:00  Address: [address] [place] [address]  Phone: [phone]    Primary Care Provider:  Name: PCP,  NONE  Phone:      Emergency Department Providers:         Primary Physician:   WILMOT MICKEY TANDA JAYSON        [facility] would like to thank you for allowing us  to assist you with your healthcare needs. The following includes patient education materials and information regarding your injury/illness.    Follow-up Instructions: You were treated today on an emergency basis, it may be wise to contact your primary care provider to notify them of your visit today. You may have been referred to your regular doctor or a specialist, please follow up as instructed. If your condition worsens or you can't get in to see the doctor, contact the Emergency Department.              With: Address: When:   Tylenol/motrin as needed for fever/ bodyaches         With: Address: When:   Follow up with primary care provider  Within 1 week             Patient Education Materials:  Viral Respiratory Infection, Easy-To-Read          Viral Infection  A viral infection is an illness that affects various parts of the body  It is caused by a germ called a virus.  Some examples of this kind of infection are:    . A cold.  . The flu (influenza).  . A respiratory syncytial virus (RSV) infection.        Gastroenteritis        Pharyngitis  HOW DO I KNOW IF I HAVE THIS INFECTION?  Most of the time this infection causes:  . A stuffy or runny nose.  . Yellow or green fluid in the nose.  . A cough.  . Sneezing.  . Tiredness (fatigue).  . Achy  muscles.  . A sore throat.  . Sweating or chills.  . A fever.  . A headache.        Nausea, vomiting, or diarrhea  HOW IS THIS INFECTION TREATED?  If the flu is diagnosed early, it may be treated with an antiviral medicine. This medicine shortens the length of time a person has symptoms. Symptoms may be treated with over-the-counter and prescription medicines, such as:  SABRA Expectorants. These make it easier to cough up mucus.  . Decongestant nasal sprays.  Doctors do not prescribe antibiotic medicines for viral infections. They do not work with this kind of infection.  HOW DO I KNOW IF I SHOULD STAY HOME?  To keep others from getting sick, stay home if you have:  . A fever.  . A lasting cough.  . A sore throat.  . A runny nose.  . Sneezing.   . Muscles aches.  . Headaches.  . Tiredness.  . Weakness.  . Chills.  . Sweating.  . An upset stomach (nausea).  HOME CARE  . Rest as  much as possible.  . Take over-the-counter and prescription medicines only as told by your doctor.  . Drink enough fluid to keep your pee (urine) clear or pale yellow.  . If the throat is sore gargle with salt water. Do this 3-4 times per day or as needed. To make a salt-water mixture, dissolve -1 tsp of salt in 1 cup of warm water. Make sure the salt dissolves all the way.  . Use nose drops made from salt water. This helps with stuffiness (congestion). It also helps soften the skin around your nose.  . Do not drink alcohol.  . Do not use tobacco products, including cigarettes, chewing tobacco, and e-cigarettes. If you need help quitting, ask your doctor.  GET HELP IF:  . Your symptoms last for 10 days or longer.  . Your symptoms get worse over time.  . You have a fever.  . You have very bad pain in your face or forehead.  . Parts of your jaw or neck become very swollen.  GET HELP RIGHT AWAY IF:  . You feel pain or pressure in your chest.  . You have shortness of breath.  . You faint or feel like you will faint.  . You keep throwing up  (vomiting).  . You feel confused.  This information is not intended to replace advice given to you by your health care provider. Make sure you discuss any questions you have with your health care provider.  Document Released: 04/24/2008 Document Revised: 04/23/2015 Document Reviewed: 10/18/2014  Elsevier Interactive Patient Education 2016 [employer].        Allergy Info: No Known Allergies    Medication Information:  [facility] ED Physicians provided you with a complete list of medications post discharge, if you have been instructed to stop taking a medication please ensure you also follow up with this information to your Primary Care Physician. Unless otherwise noted, patient will continue to take medications as prescribed prior to the Emergency Room visit. Any specific questions regarding your chronic medications and dosages should be discussed with your physician(s) and pharmacist.          New Medications  Printed Prescriptions  ondansetron (Zofran ODT 4 mg oral tablet, disintegrating) 1 Tabs Oral (given by mouth) every 8 hours as needed nausea. Refills: 0.  Last Dose:____________________  oseltamivir (Tamiflu 75 mg oral capsule) 1 Capsules Oral (given by mouth) 2 times a day for 5 Days. Refills: 0.  Last Dose:____________________  Medications that have not changed  Other Medications  furosemide  (furosemide  20 mg oral tablet) TAKE 1 TABLET BY MOUTH EVERY DAY IF NEEDED.  Last Dose:____________________  norgestimate-ethinyl estradiol (Sprintec 0.25 mg-35 mcg oral tablet) TAKE 1 TABLET BY MOUTH EVERY DAY.  Last Dose:____________________      Medications Administered During Visit:              Medication Dose Route   Sodium Chloride  0.9% 1000 mL IV Piggyback   ketorolac 15 mg IV Push         Major Tests and Procedures:  The following procedures and tests were performed during your ED visit.  PROCEDURES%>  PROCEDURES  COMMENTS%>          ---------------------------------------------------------------------------------------------------------------------  [facility] allows you to manage your health, view your test results, and retrieve your discharge documents from your hospital stay securely and conveniently from your computer.    To begin the enrollment process, visit [email or web address] Click on "Sign up now" under [facility]  Comment:

## 2018-06-05 NOTE — ED Notes (Signed)
ED Patient Education Note     Patient Education Materials Follows:  Easy-to-Read          Viral Infection  A viral infection is an illness that affects various parts of the body  It is caused by a germ called a virus.  Some examples of this kind of infection are:    ??? A cold.  ??? The flu (influenza).  ??? A respiratory syncytial virus (RSV) infection.        Gastroenteritis        Pharyngitis  HOW DO I KNOW IF I HAVE THIS INFECTION?  Most of the time this infection causes:  ??? A stuffy or runny nose.  ??? Yellow or green fluid in the nose.  ??? A cough.  ??? Sneezing.  ??? Tiredness (fatigue).  ??? Achy muscles.  ??? A sore throat.  ??? Sweating or chills.  ??? A fever.  ??? A headache.        Nausea, vomiting, or diarrhea  HOW IS THIS INFECTION TREATED?  If the flu is diagnosed early, it may be treated with an antiviral medicine. This medicine shortens the length of time a person has symptoms. Symptoms may be treated with over-the-counter and prescription medicines, such as:  ??? Expectorants. These make it easier to cough up mucus.  ??? Decongestant nasal sprays.  Doctors do not prescribe antibiotic medicines for viral infections. They do not work with this kind of infection.  HOW DO I KNOW IF I SHOULD STAY HOME?  To keep others from getting sick, stay home if you have:  ??? A fever.  ??? A lasting cough.  ??? A sore throat.  ??? A runny nose.  ??? Sneezing.   ??? Muscles aches.  ??? Headaches.  ??? Tiredness.  ??? Weakness.  ??? Chills.  ??? Sweating.  ??? An upset stomach (nausea).  HOME CARE  ??? Rest as much as possible.  ??? Take over-the-counter and prescription medicines only as told by your doctor.  ??? Drink enough fluid to keep your pee (urine) clear or pale yellow.  ??? If the throat is sore gargle with salt water. Do this 3???4 times per day or as needed. To make a salt???water mixture, dissolve ?????1 tsp of salt in 1 cup of warm water. Make sure the salt dissolves all the way.  ??? Use nose drops made from salt water. This helps with stuffiness (congestion). It also  helps soften the skin around your nose.  ??? Do not drink alcohol.  ??? Do not use tobacco products, including cigarettes, chewing tobacco, and e-cigarettes. If you need help quitting, ask your doctor.  GET HELP IF:  ??? Your symptoms last for 10 days or longer.  ??? Your symptoms get worse over time.  ??? You have a fever.  ??? You have very bad pain in your face or forehead.  ??? Parts of your jaw or neck become very swollen.  GET HELP RIGHT AWAY IF:  ??? You feel pain or pressure in your chest.  ??? You have shortness of breath.  ??? You faint or feel like you will faint.  ??? You keep throwing up (vomiting).  ??? You feel confused.  This information is not intended to replace advice given to you by your health care provider. Make sure you discuss any questions you have with your health care provider.  Document Released: 04/24/2008 Document Revised: 04/23/2015 Document Reviewed: 10/18/2014  Elsevier Interactive Patient Education ??2016 Elsevier Inc.

## 2018-06-10 LAB — CULTURE, BLOOD 1

## 2018-07-30 LAB — CULTURE, URINE: FINAL REPORT: NO GROWTH

## 2018-08-02 LAB — IGP, APTIMA HPV (199330)
.: 0
HPV Aptima: NEGATIVE

## 2020-04-03 LAB — COMPREHENSIVE METABOLIC PANEL
ALT: 16 U/L (ref 0–33)
AST: 23 U/L (ref 0–32)
Albumin/Globulin Ratio: 2.1 mmol/L (ref 1.00–2.70)
Albumin: 4.5 g/dL (ref 3.5–5.2)
Alk Phosphatase: 67 U/L (ref 35–117)
Anion Gap: 11 mmol/L (ref 2–17)
BUN: 10 mg/dL (ref 6–20)
CO2: 25 mmol/L (ref 22–29)
Calcium: 9.4 mg/dL (ref 8.6–10.0)
Chloride: 103 mmol/L (ref 98–107)
Creatinine: 0.7 mg/dL (ref 0.5–1.0)
GFR African American: 127 mL/min/{1.73_m2} (ref >=90)
GFR Non-African American: 110 mL/min/{1.73_m2} (ref >=90)
Globulin: 2 g/dL (ref 1.9–4.4)
Glucose: 96 mg/dL (ref 70–99)
OSMOLALITY CALCULATED: 276 mOsm/kg (ref 270–287)
Potassium: 4.8 mmol/L (ref 3.5–5.3)
Sodium: 139 mmol/L (ref 135–145)
Total Bilirubin: 0.4 mg/dL (ref 0.00–1.20)
Total Protein: 6.6 g/dL (ref 6.4–8.3)

## 2020-04-03 LAB — IRON AND TIBC
Iron Saturation: 20 % (ref 20–40)
Iron: 71 ug/dL (ref 37–145)
TIBC: 355 ug/dL (ref 250–450)
UIBC: 284 ug/dL (ref 112.0–347.0)

## 2020-04-03 LAB — CBC WITH AUTO DIFFERENTIAL
Absolute Baso #: 0 10*3/uL (ref 0.0–0.2)
Absolute Eos #: 0.2 10*3/uL (ref 0.0–0.5)
Absolute Lymph #: 2.7 10*3/uL (ref 1.0–3.2)
Absolute Mono #: 0.7 10*3/uL (ref 0.3–1.0)
Basophils %: 0.5 % (ref 0.0–2.0)
Eosinophils %: 2.3 % (ref 0.0–7.0)
Hematocrit: 37.3 % (ref 34.0–47.0)
Hemoglobin: 12 g/dL (ref 11.5–15.7)
Immature Grans (Abs): 0.03 10*3/uL (ref 0.00–0.06)
Immature Granulocytes: 0.3 % (ref 0.1–0.6)
Lymphocytes: 31.1 % (ref 15.0–45.0)
MCH: 28.2 pg (ref 27.0–34.5)
MCHC: 32.2 g/dL (ref 32.0–36.0)
MCV: 87.6 fL (ref 81.0–99.0)
MPV: 10.9 fL (ref 7.2–13.2)
Monocytes: 7.6 % (ref 4.0–12.0)
NRBC Absolute: 0 10*3/uL (ref 0.000–0.012)
NRBC Automated: 0 % (ref 0.0–0.2)
Neutrophils %: 58.2 % (ref 42.0–74.0)
Neutrophils Absolute: 5 10*3/uL (ref 1.6–7.3)
Platelets: 319 10*3/uL (ref 140–440)
RBC: 4.26 x10e6/mcL (ref 3.60–5.20)
RDW: 13.4 % (ref 11.0–16.0)
WBC: 8.6 10*3/uL (ref 3.8–10.6)

## 2020-04-03 LAB — LIPID PANEL
Chol/HDL Ratio: 2.3 (ref 0.0–4.4)
Cholesterol: 202 mg/dL — ABNORMAL HIGH (ref 100–200)
HDL: 89 mg/dL (ref >=50)
LDL Cholesterol: 96.8 mg/dL (ref 0.0–100.0)
LDL/HDL Ratio: 1.1
Triglycerides: 81 mg/dL (ref 0–149)
VLDL: 16.2 mg/dL (ref 5.0–40.0)

## 2020-04-03 LAB — TSH WITH REFLEX TO FT4: TSH: 1.77 mcIU/mL (ref 0.358–3.740)

## 2021-01-10 NOTE — Telephone Encounter (Signed)
Patient tested positive last night for Covid and wanted to know if the medication Paxlovid can be called in. Please call to advise  Symtpoms-congestion,fatigue and headache    [id number]  [phone]

## 2021-02-26 NOTE — Telephone Encounter (Signed)
Pt is calling in with complaints of not feeling like she can take a deep breath. Pt states she is not struggling to breath and does not feel its an emergency. Pt states she had COVID and since then she has had some issues with breathing. Pt has an appointment to see Dr. Timmothy Euler on 04/08/2021 for CPE but would like something sooner if possible. Pt feels she may need some testing done or maybe a referral. Please advise.

## 2021-02-26 NOTE — Telephone Encounter (Signed)
scheduled

## 2021-03-04 ENCOUNTER — Encounter: Attending: Family | Primary: Family Medicine

## 2021-03-07 ENCOUNTER — Ambulatory Visit: Admit: 2021-03-07 | Discharge: 2021-03-07 | Payer: BLUE CROSS/BLUE SHIELD | Attending: Family | Primary: Family Medicine

## 2021-03-07 DIAGNOSIS — R0602 Shortness of breath: Secondary | ICD-10-CM

## 2021-03-07 MED ORDER — BUDESONIDE 90 MCG/ACT IN AEPB
90 MCG/ACT | Freq: Two times a day (BID) | RESPIRATORY_TRACT | 0 refills | Status: DC
Start: 2021-03-07 — End: 2021-04-08

## 2021-03-07 MED ORDER — FUROSEMIDE 20 MG PO TABS
20 MG | ORAL_TABLET | ORAL | 4 refills | Status: DC
Start: 2021-03-07 — End: 2021-04-08

## 2021-03-07 NOTE — Telephone Encounter (Signed)
Patient returned call not able to get an answer on back line. Please call again when you're available.

## 2021-03-07 NOTE — Progress Notes (Signed)
Lauren [name] (DOB:  [birth year]) is a 40 y.o. female, here for evaluation of the following chief complaint(s):  Breathing Problem (Is requesting to have COVID test done due to having similar breathing problems as before when previously having COVID.)         ASSESSMENT/PLAN:  1. SOB (shortness of breath)  Comments:  Exam was normal. PFT today shows mod obstruction and mild restriction. Pulmocort BID prn x 14 days. Referral placed to Dr. Oris Drone for evaluation.  Orders:  -     Spirometry Without Bronchodilator  -     Sidonie Dickens, MD - Pulmonology  2. Vertigo  Comments:  Stable. Medication refilled.  Orders:  -     furosemide (LASIX) 20 MG tablet; 1 tablet Orally One a day if needed for 90 days, Disp-90 tablet, R-4Normal    No results found for any visits on 03/07/21.   No follow-ups on file.       Subjective   SUBJECTIVE/OBJECTIVE:  Pt reports mild SOB x a couple months. SOB is at rest and with exertion. Exertion does not make it any worse. She is able to exercise like normal. She has a feeling that she is not taking as deep of breaths since having Covid 2 years ago. She also had Covid 2 months ago and wonders if her lung functioning is reduced from that. She had a PFTs in 2020 after Covid then a couple weeks later. The first showed mild obstruction which resolved in the second. She would like repeat PFT to compare to the other two. No cough, chest pain, wheezing, hx of asthma or smoking, etc. She had a steroid inhaler last time that she used for two weeks that resolved her SOB and she would like a refill. She also requests refill of Lasix which she takes for vertigo. Her symptoms are well controlled and she denies any side effects.      No Known Allergies   Current Outpatient Medications   Medication Sig Dispense Refill    Ciclopirox 1 % SHAM 1 APPLICATION EXTERNALLY AS NEEDED 30 DAYS      polyethylene glycol (GLYCOLAX) 17 g packet Take 17 g by mouth daily      furosemide (LASIX) 20 MG tablet 1 tablet Orally One a day  if needed for 90 days 90 tablet 4    budesonide (PULMICORT) 90 MCG/ACT AEPB inhaler Inhale 2 puffs into the lungs in the morning and 2 puffs in the evening. Do all this for 14 days. 1 each 0    Fexofenadine HCl (ALLEGRA ALLERGY PO) Take by mouth       No current facility-administered medications for this visit.      Past Medical History:   Diagnosis Date    Vertigo       Past Surgical History:   Procedure Laterality Date    BREAST REDUCTION SURGERY  07/2015    Dr Freddy Jaksch    CESAREAN SECTION  2015    DILATION AND CURETTAGE OF UTERUS  2014    HEMORRHOID SURGERY  07/2020    LASIK      TONSILLECTOMY  1985      Family History   Problem Relation Age of Onset    Alcohol Abuse Mother     High Blood Pressure Father     Brain Cancer Maternal Grandmother     Gout Maternal Grandfather     Stroke Maternal Grandfather     High Blood Pressure Maternal Grandfather     Heart Attack Maternal  Grandfather     Prostate Cancer Paternal Grandfather     No Known Problems Son       Social History     Tobacco Use    Smoking status: Never     Passive exposure: Never    Smokeless tobacco: Never   Vaping Use    Vaping Use: Never used   Substance Use Topics    Alcohol use: Yes     Alcohol/week: 3.0 standard drinks     Types: 3 Glasses of wine per week     Comment: 3-4 drinks per week    Drug use: Never           Objective   BP (!) 110/58 (Site: Left Upper Arm)    Pulse 79    Ht 5\' 5"  (1.651 m)    Wt 177 lb (80.3 kg)    SpO2 97%    BMI 29.45 kg/m??  Body mass index is 29.45 kg/m??.   Physical Exam  Constitutional:       Appearance: Normal appearance.   HENT:      Head: Normocephalic and atraumatic.   Cardiovascular:      Rate and Rhythm: Normal rate and regular rhythm.   Pulmonary:      Effort: Pulmonary effort is normal.      Breath sounds: Normal breath sounds.   Musculoskeletal:      Cervical back: Normal range of motion.   Skin:     General: Skin is warm and dry.   Neurological:      General: No focal deficit present.      Mental Status: She is  alert and oriented to person, place, and time.   Psychiatric:         Mood and Affect: Mood normal.         Behavior: Behavior normal.          An electronic signature was used to authenticate this note.    -- , APRN - NP

## 2021-03-07 NOTE — Telephone Encounter (Signed)
Left patient VM informing her that I wanted to speak to her about lab results.

## 2021-03-11 NOTE — Telephone Encounter (Signed)
na

## 2021-03-11 NOTE — Telephone Encounter (Signed)
Patient is requesting refill  What [facility] [phone]  Have there been any medication changes since your last visit?No    Medication:Breo  Dosage:Not sure  Frequency:twice daily  Quantity:90 D/S    Was an appointment offered?No she has one in 3 weeks  If the patient declined please state why.     Pt says that the inhaler you prescribed is not as affective as the [facility] and she would like it instead.

## 2021-03-11 NOTE — Telephone Encounter (Signed)
Pt called back to see if she can get the [facility] inhaler [facility] today. She knows she will have to pay out of pocket, but she is not getting the relief she needs from the new one that was just prescribed.

## 2021-03-12 MED ORDER — FLUTICASONE FUROATE-VILANTEROL 100-25 MCG/INH IN AEPB
100-25 MCG/INH | Freq: Every day | RESPIRATORY_TRACT | 0 refills | Status: DC
Start: 2021-03-12 — End: 2021-04-08

## 2021-03-12 NOTE — Telephone Encounter (Signed)
Please advise

## 2021-03-12 NOTE — Telephone Encounter (Signed)
See previous documentation regarding new inhaler and patient's requests. Per protocol/nurse discretion sent to practice for provider discretion.

## 2021-03-12 NOTE — Telephone Encounter (Signed)
PA started via Cover my meds.

## 2021-03-13 NOTE — Telephone Encounter (Signed)
[employer] attempting to complete patient's prior authorization for the patient's flutiasone-vilanterol inhaler. Please give them a call when available using reference number:  [id number].

## 2021-03-13 NOTE — Telephone Encounter (Signed)
Spoke to [employer]. They will submit PA for review.

## 2021-03-14 NOTE — Telephone Encounter (Signed)
She knew insurance would not cover it and stated she would pay out of pocket

## 2021-04-08 ENCOUNTER — Ambulatory Visit
Admit: 2021-04-08 | Discharge: 2021-04-08 | Payer: BLUE CROSS/BLUE SHIELD | Attending: Family Medicine | Primary: Family Medicine

## 2021-04-08 DIAGNOSIS — R42 Dizziness and giddiness: Secondary | ICD-10-CM

## 2021-04-08 MED ORDER — FUROSEMIDE 20 MG PO TABS
20 MG | ORAL_TABLET | ORAL | 4 refills | Status: DC
Start: 2021-04-08 — End: 2023-09-01

## 2021-04-08 MED ORDER — CICLOPIROX 1 % EX SHAM
1 % | CUTANEOUS | 0 refills | Status: DC
Start: 2021-04-08 — End: 2022-08-11

## 2021-04-08 NOTE — Progress Notes (Signed)
.  Review of Systems   Constitutional: Negative.    HENT: Negative.     Eyes: Negative.    Respiratory: Negative.     Cardiovascular: Negative.    Gastrointestinal: Negative.    Endocrine: Negative.    Genitourinary: Negative.    Musculoskeletal: Negative.    Skin: Negative.    Allergic/Immunologic: Negative.    Neurological: Negative.    Hematological: Negative.    Psychiatric/Behavioral: Negative.

## 2021-04-08 NOTE — Progress Notes (Signed)
Lauren [name] (DOB:  [birth year]) is a 40 y.o. female,New patient, here for evaluation of the following chief complaint(s):  Shortness of Breath (Had COVID in August now having SOB and difficulty breathing)        Subjective   SUBJECTIVE/OBJECTIVE:  HPI    This is a 40 year old female that presents to establish care. Has an appointment with pulmonology later this month to  determine is she has adult asthma. Went to [facility] ER earlier this month for fatigue and chest pain and was rx albuterol inhaler. CT scan which was negative for PE. Has not had to use the albuterol inhaler for the past 2 days. Is still having difficulty taking a deep breath in. No other concerns at this time.     No Known Allergies    Outpatient Encounter Medications as of 04/08/2021   Medication Sig Dispense Refill    albuterol sulfate HFA (VENTOLIN HFA) 108 (90 Base) MCG/ACT inhaler Inhale 2 puffs into the lungs every 6 hours as needed for Wheezing      Ciclopirox 1 % SHAM 1 APPLICATION EXTERNALLY AS NEEDED 30 DAYS      furosemide (LASIX) 20 MG tablet 1 tablet Orally One a day if needed for 90 days 90 tablet 4    Fexofenadine HCl (ALLEGRA ALLERGY PO) Take by mouth      [DISCONTINUED] fluticasone-vilanterol (BREO ELLIPTA) 100-25 MCG/INH AEPB inhaler Inhale 1 puff into the lungs daily 30 each 0    [DISCONTINUED] polyethylene glycol (GLYCOLAX) 17 g packet Take 17 g by mouth daily      [DISCONTINUED] budesonide (PULMICORT) 90 MCG/ACT AEPB inhaler Inhale 2 puffs into the lungs in the morning and 2 puffs in the evening. Do all this for 14 days. 1 each 0     No facility-administered encounter medications on file as of 04/08/2021.        Past Medical History:   Diagnosis Date    Vertigo       Past Surgical History:   Procedure Laterality Date    BREAST REDUCTION SURGERY  07/2015    Dr Freddy Jaksch    CESAREAN SECTION  2015    DILATION AND CURETTAGE OF UTERUS  2014    HEMORRHOID SURGERY  07/2020    LASIK      TONSILLECTOMY  1985      Family History    Problem Relation Age of Onset    Alcohol Abuse Mother     High Blood Pressure Father     Brain Cancer Maternal Grandmother     Gout Maternal Grandfather     Stroke Maternal Grandfather     High Blood Pressure Maternal Grandfather     Heart Attack Maternal Grandfather     Prostate Cancer Paternal Grandfather     No Known Problems Son       Social History     Tobacco Use    Smoking status: Never     Passive exposure: Never    Smokeless tobacco: Never   Vaping Use    Vaping Use: Never used   Substance Use Topics    Alcohol use: Yes     Alcohol/week: 3.0 standard drinks     Types: 3 Glasses of wine per week     Comment: 3-4 drinks per week    Drug use: Never      Social History     Tobacco Use   Smoking Status Never    Passive exposure: Never   Smokeless Tobacco Never  Social History     Substance and Sexual Activity   Alcohol Use Yes    Alcohol/week: 3.0 standard drinks    Types: 3 Glasses of wine per week    Comment: 3-4 drinks per week      .last    Review of Systems   Constitutional:  Negative for chills and fever.   HENT:  Negative for ear discharge and sinus pressure.    Cardiovascular:  Negative for chest pain and palpitations.   Gastrointestinal:  Negative for nausea and vomiting.   Endocrine: Negative for cold intolerance and heat intolerance.   Musculoskeletal:  Negative for back pain, joint swelling and neck pain.   Psychiatric/Behavioral:  Negative for agitation and confusion.             Objective     BP 102/60 (Site: Left Upper Arm, Position: Sitting)    Pulse 78    Ht 5\' 5"  (1.651 m)    Wt 176 lb 3.2 oz (79.9 kg)    SpO2 100%    BMI 29.32 kg/m??      Physical Exam  Vitals reviewed.   Constitutional:       Appearance: Normal appearance.   HENT:      Head: Normocephalic and atraumatic.      Right Ear: Tympanic membrane and ear canal normal.      Left Ear: Tympanic membrane and ear canal normal.      Nose: Nose normal.      Mouth/Throat:      Mouth: Mucous membranes are moist.      Pharynx: Oropharynx is  clear.   Eyes:      Conjunctiva/sclera: Conjunctivae normal.      Pupils: Pupils are equal, round, and reactive to light.   Cardiovascular:      Rate and Rhythm: Normal rate and regular rhythm.      Pulses: Normal pulses.   Pulmonary:      Effort: Pulmonary effort is normal.   Abdominal:      General: Bowel sounds are normal.      Palpations: Abdomen is soft.   Musculoskeletal:         General: Normal range of motion.   Skin:     General: Skin is warm and dry.      Capillary Refill: Capillary refill takes less than 2 seconds.   Neurological:      General: No focal deficit present.      Mental Status: She is alert. Mental status is at baseline.   Psychiatric:         Mood and Affect: Mood normal.         Thought Content: Thought content normal.            ASSESSMENT/PLAN:  1. Vertigo  Comments:  Stable. Medication refilled.  Orders:  -     furosemide (LASIX) 20 MG tablet; 1 tablet Orally One a day if needed for 90 days, Disp-90 tablet, R-4Normal  2. Mixed hyperlipidemia  -     Lipid Panel; Future  3. Reactive airway disease without complication, unspecified asthma severity, unspecified whether persistent  Comments:  Is taking albuterol, has stopped daily inhaler due to having methacholine challenge test done at the end of this month.   4. Iron deficiency anemia, unspecified iron deficiency anemia type  -     Cbc With Auto Differential; Future  5. Screening for endocrine disorder  -     Comprehensive Metabolic Panel; Future  -  TSH with Reflex; Future      No results found for any visits on 04/08/21.     Return in about 6 months (around 10/06/2021).    An electronic signature was used to authenticate this note.    --Fayrene Helper, MD

## 2021-04-10 LAB — CBC WITH AUTO DIFFERENTIAL
Basophils %: 0 %
Basophils Absolute: 0 10*3/uL (ref 0.0–0.2)
Eosinophils %: 1 %
Eosinophils Absolute: 0.1 10*3/uL (ref 0.0–0.4)
Hematocrit: 38.2 % (ref 34.0–46.6)
Hemoglobin: 12.7 g/dL (ref 11.1–15.9)
Immature Grans (Abs): 0 10*3/uL (ref 0.0–0.1)
Immature Granulocytes: 0 %
Lymphocytes %: 34 %
Lymphocytes Absolute: 2.7 10*3/uL (ref 0.7–3.1)
MCH: 28.9 pg (ref 26.6–33.0)
MCHC: 33.2 g/dL (ref 31.5–35.7)
MCV: 87 fL (ref 79–97)
Monocytes %: 9 %
Monocytes Absolute: 0.7 10*3/uL (ref 0.1–0.9)
Neutrophils %: 56 %
Neutrophils Absolute: 4.5 10*3/uL (ref 1.4–7.0)
Platelets: 334 10*3/uL (ref 150–450)
RBC: 4.4 x10E6/uL (ref 3.77–5.28)
RDW: 12.6 % (ref 11.7–15.4)
WBC: 8.1 10*3/uL (ref 3.4–10.8)

## 2021-04-10 LAB — COMPREHENSIVE METABOLIC PANEL
ALT: 16 IU/L (ref 0–32)
AST: 27 IU/L (ref 0–40)
Albumin/Globulin Ratio: 2 (ref 1.2–2.2)
Albumin: 4.7 g/dL (ref 3.8–4.8)
Alkaline Phosphatase: 65 IU/L (ref 44–121)
BUN/Creatinine Ratio: 13 (ref 9–23)
BUN: 11 mg/dL (ref 6–20)
CO2: 22 mmol/L (ref 20–29)
Calcium: 9.4 mg/dL (ref 8.7–10.2)
Chloride: 101 mmol/L (ref 96–106)
Creatinine: 0.87 mg/dL (ref 0.57–1.00)
Est, Glomerular Filtration Rate: 87 mL/min/{1.73_m2} (ref >59)
Globulin, Total: 2.3 g/dL (ref 1.5–4.5)
Glucose: 87 mg/dL (ref 70–99)
Potassium: 4.5 mmol/L (ref 3.5–5.2)
Sodium: 140 mmol/L (ref 134–144)
Total Bilirubin: 0.5 mg/dL (ref 0.0–1.2)
Total Protein: 7 g/dL (ref 6.0–8.5)

## 2021-04-10 LAB — LIPID PANEL
Cholesterol: 211 mg/dL — ABNORMAL HIGH (ref 100–199)
HDL: 96 mg/dL (ref >39)
LDL Calculated: 102 mg/dL — ABNORMAL HIGH (ref 0–99)
Triglycerides: 71 mg/dL (ref 0–149)
VLDL Cholesterol Calculated: 13 mg/dL (ref 5–40)

## 2021-04-10 LAB — TSH WITH REFLEX: TSH: 2.56 u[IU]/mL (ref 0.450–4.500)

## 2021-04-23 ENCOUNTER — Encounter

## 2021-05-01 ENCOUNTER — Inpatient Hospital Stay: Admit: 2021-05-01 | Discharge: 2021-05-01 | Payer: BLUE CROSS/BLUE SHIELD | Primary: Family Medicine

## 2021-05-01 DIAGNOSIS — R0602 Shortness of breath: Secondary | ICD-10-CM

## 2021-05-01 MED FILL — ALBUTEROL SULFATE (2.5 MG/3ML) 0.083% IN NEBU: RESPIRATORY_TRACT | Qty: 3

## 2021-05-01 NOTE — Other (Signed)
METHACHOLINE CHALLENGE RESULTS:    PC20 never achieved the necessary 20% reduction in FEV1 from baseline indicating: normal bronchial responsiveness.        PC20(mg/ml) Interpretation  > 16 Normal bronchial responsiveness  4.0-16 Borderline BHR  1.0-4.0 Mild BHR (positive test)  < 1.0 Moderate to severe BHR       Marcelle Smiling, DO  Pulmonary & Critical Care Physician  [facility]

## 2021-07-24 ENCOUNTER — Ambulatory Visit
Admit: 2021-07-24 | Discharge: 2021-07-24 | Payer: BLUE CROSS/BLUE SHIELD | Attending: Internal Medicine | Primary: Family Medicine

## 2021-07-24 DIAGNOSIS — D509 Iron deficiency anemia, unspecified: Secondary | ICD-10-CM

## 2021-07-24 NOTE — Progress Notes (Signed)
Hematology & Oncology New Patient Visit            Date:07/24/2021        Patient Name:Lauren [name]       Date of Birth:1981-01-06         Referring Provider: Billey Co  Primary care provider: Dr. Fayrene Helper    Age: 42 y.o. female    Diagnoses:  Iron deficiency anemia.  Menorrhagia.  Previous frequent donation of blood.    Treatment History:  Chronic constipation on MiraLAX daily, do not recommend initiation of an iron supplement.  Pending Injectafer 750 mg IV x2 doses.    History of Present Illness: Lauren [name] is a pleasant 41 year old premenopausal female with a thyroid disorder on T3 supplementation, chronic constipation who presents to establish care regarding iron deficiency anemia.  She reports following COVID last year she had shortness of breath and heart palpitations.  She had a normal pulmonary work-up.  She was started on T3 thyroid replacement and reports some improvement in energy level, decreased hair loss and some improvement in respiratory symptoms.  She has persistent fatigue.  She endorses ice craving.  Her periods are heavy.  She previously reports frequent blood donation but has not done this recently.  She has never received blood or iron infusion.  She requires MiraLAX on a daily basis.  She has not tried an iron supplement in the past.  She does have a history of hemorrhoids and required a hemorrhoidectomy last year.    Past Medical History:  Past Medical History:   Diagnosis Date    Vertigo         Past Surgical History:  Past Surgical History:   Procedure Laterality Date    BREAST REDUCTION SURGERY  07/2015    Dr Freddy Jaksch    CESAREAN SECTION  2015    DILATION AND CURETTAGE OF UTERUS  2014    HEMORRHOID SURGERY  07/2020    LASIK      TONSILLECTOMY  1985        Social History:  Social History       Tobacco History       Smoking Status  Never      Passive Exposure  Never      Smokeless Tobacco Use  Never              Alcohol History       Alcohol Use Status  Yes Drinks/Week  3 Glasses  of wine per week Amount  3.0 standard drinks of alcohol/wk Comment  3-4 drinks per week              Drug Use       Drug Use Status  Never              Sexual Activity       Sexually Active  Not Asked                    Family History:  Family History   Problem Relation Age of Onset    Alcohol Abuse Mother     High Blood Pressure Father     Brain Cancer Maternal Grandmother     Gout Maternal Grandfather     Stroke Maternal Grandfather     High Blood Pressure Maternal Grandfather     Heart Attack Maternal Grandfather     Prostate Cancer Paternal Grandfather     No Known Problems Son        Allergies:  Patient has no known allergies.    Medications:  Prior to Admission medications    Medication Sig Start Date End Date Taking? Authorizing Provider   albuterol sulfate HFA (VENTOLIN HFA) 108 (90 Base) MCG/ACT inhaler Inhale 2 puffs into the lungs every 6 hours as needed for Wheezing    Historical Provider, MD   Ciclopirox 1 % SHAM 1 APPLICATION EXTERNALLY AS NEEDED 30 DAYS 04/08/21   Fayrene Helper, MD   furosemide (LASIX) 20 MG tablet 1 tablet Orally One a day if needed for 90 days 04/08/21   Fayrene Helper, MD   Fexofenadine HCl (ALLEGRA ALLERGY PO) Take by mouth    Historical Provider, MD        Review of Systems:  A 14 point ROS was performed. Pertinent positives included in HPI, otherwise ROS negative.    Physical Exam:  BP 101/66 (Site: Left Upper Arm, Position: Sitting, Cuff Size: Medium Adult)    Pulse 87    Temp 98 ??F (36.7 ??C) (Oral)    Resp 16    Ht 5\' 4"  (1.626 m)    Wt 184 lb (83.5 kg)    SpO2 100%    BMI 31.58 kg/m??     GEN: Appears well, in no apparent distress.  HEENT: EOMI. No scleral icterus. Normocephalic, atraumatic.  CV: Regular rate and rhythm. No murmurs rubs or gallops.  RESP: Breathing non-labored. Clear breath sounds bilaterally.  ABD: Soft, nontender, nondistended. No rebound or guarding. No hepatomegaly. No splenomegaly.  MSK: Normal strength and range of motion. No tenderness or  swelling.  SKIN: No rashes or lesions.  NEURO: Alert, oriented x3. CN II - XII grossly intact.  PSYCH: Appropriate mood, normal affect.  LYMPH: No palpable cervical, supraclavicular or axillary LAD.    Laboratory Data:  Lab Results   Component Value Date    WBC 8.1 04/09/2021    HGB 12.7 04/09/2021    HCT 38.2 04/09/2021    MCV 87 04/09/2021    PLT 334 04/09/2021       Lab Results   Component Value Date    IRON 71 04/03/2020    TIBC 355 04/03/2020   Recent labs from 07/09/2021 with ferritin 8, serum iron 44, iron saturation 11%.    ASSESSMENT/PLAN:  Severe iron deficiency: Most recent hemoglobin was 12.7.  She has evidence of severe iron deficiency with a ferritin of 8 with most recent labs from several weeks ago on 07/09/2021.  With chronic constipation, history of hemorrhoids requiring hemorrhoidectomy, I am hesitant to start her on an oral iron supplement given potential risk of constipation.  Instead I recommended IV iron replacement given symptoms related to iron deficiency with fatigue, ice craving, shortness of breath, heart palpitations.  We reviewed proceeding with Injectafer 750 mg IV x2 doses.  We reviewed potential side effects including but not limited to risk of anaphylaxis/allergic reaction.  She is agreeable to proceed.  I will monitor labs at 3 months to determine what her requirement will be in terms of IV iron replacement moving forward.  I suspect because of underlying iron deficiency is related to menorrhagia.  I advised him that no longer donate blood until iron deficiency and anemia have resolved.    Proceed with Injectafer.  I will see her in 3 months with repeat labs.  She is requesting transfer to [facility] location.    [facility], MD

## 2021-08-07 ENCOUNTER — Inpatient Hospital Stay: Admit: 2021-08-07 | Discharge: 2021-08-07 | Payer: BLUE CROSS/BLUE SHIELD | Primary: Family Medicine

## 2021-08-07 DIAGNOSIS — D509 Iron deficiency anemia, unspecified: Secondary | ICD-10-CM

## 2021-08-07 MED ORDER — SODIUM CHLORIDE 0.9 % IV SOLN
0.9 % | INTRAVENOUS | Status: DC | PRN
Start: 2021-08-07 — End: 2021-08-08
  Administered 2021-08-07: 16:00:00 100 mL/h via INTRAVENOUS

## 2021-08-07 MED ORDER — FERRIC CARBOXYMALTOSE 750 MG/15ML IV SOLN
750 MG/15ML | Freq: Once | INTRAVENOUS | Status: AC
Start: 2021-08-07 — End: 2021-08-07
  Administered 2021-08-07: 16:00:00 750 mg via INTRAVENOUS

## 2021-08-07 MED FILL — INJECTAFER 750 MG/15ML IV SOLN: 750 MG/15ML | INTRAVENOUS | Qty: 15

## 2021-08-07 NOTE — Plan of Care (Signed)
Knowledge deficit care plan type treatment plan was reviewed with the patient and the outcome was reinforced.

## 2021-08-14 ENCOUNTER — Ambulatory Visit: Payer: BLUE CROSS/BLUE SHIELD | Primary: Family Medicine

## 2021-08-14 DIAGNOSIS — D509 Iron deficiency anemia, unspecified: Secondary | ICD-10-CM

## 2021-08-14 MED ORDER — SODIUM CHLORIDE 0.9 % IV SOLN
0.9 % | Freq: Once | INTRAVENOUS | Status: AC
Start: 2021-08-14 — End: 2021-08-14
  Administered 2021-08-14: 15:00:00 750 mg via INTRAVENOUS

## 2021-08-14 MED ORDER — SODIUM CHLORIDE 0.9 % IV SOLN
0.9 % | INTRAVENOUS | Status: DC | PRN
Start: 2021-08-14 — End: 2021-08-15
  Administered 2021-08-14: 15:00:00 100 mL/h via INTRAVENOUS

## 2021-08-14 MED FILL — INJECTAFER 750 MG/15ML IV SOLN: 750 MG/15ML | INTRAVENOUS | Qty: 15

## 2021-08-14 NOTE — Plan of Care (Signed)
Knowledge deficit care plan type treatment plan was reviewed with the patient and the outcome was reinforced.

## 2021-10-24 ENCOUNTER — Ambulatory Visit: Admit: 2021-10-24 | Discharge: 2021-10-24 | Payer: BLUE CROSS/BLUE SHIELD | Primary: Family Medicine

## 2021-10-24 ENCOUNTER — Ambulatory Visit
Admit: 2021-10-24 | Discharge: 2021-10-24 | Payer: BLUE CROSS/BLUE SHIELD | Attending: Internal Medicine | Primary: Family Medicine

## 2021-10-24 DIAGNOSIS — D509 Iron deficiency anemia, unspecified: Secondary | ICD-10-CM

## 2021-10-24 LAB — CBC WITH AUTO DIFFERENTIAL
Absolute Lymph #: 2.3 10*3/uL (ref 0.6–4.1)
Absolute Mid: 0.8 10*3/uL (ref 0.0–1.8)
Granulocyte Absolute Count: 8.2 10*3/uL — ABNORMAL HIGH (ref 2.0–7.8)
Granulocytes %: 72.8 % (ref 37.0–92.0)
Hematocrit: 40 % (ref 37.0–51.0)
Hemoglobin: 12.9 g/dL (ref 12.0–17.5)
Lymphocytes: 20.5 % (ref 10.0–58.5)
MCH: 29.3 pg (ref 26.0–32.0)
MCHC: 32.3 g/dL (ref 31.0–36.0)
MCV: 90.7 fL (ref 80.0–97.5)
MID %: 6.7 % (ref 0.1–24.0)
MPV: 7.9 fL (ref 0.0–49.9)
Platelets: 291 10*3/uL (ref 140–440)
RBC: 4.41 M/uL (ref 4.20–6.30)
RDW: 15.7 % — ABNORMAL HIGH (ref 11.5–14.5)
WBC: 11.2 10*3/uL — ABNORMAL HIGH (ref 4.1–10.9)

## 2021-10-24 NOTE — Progress Notes (Signed)
Hematology & Oncology   Follow-up visit          Date:10/24/2021        Patient Name:Lauren [name]       Date of Birth:16-Nov-1980         Referring Provider: Billey Co  Primary care provider: Dr. Fayrene Helper    Age: 41 y.o. female    Diagnoses:  Iron deficiency anemia.  Menorrhagia.  Previous frequent donation of blood, discontinued.    Treatment History:  Chronic constipation on MiraLAX daily, do not recommend initiation of an iron supplement.  Injectafer 750 mg IV x2 doses completed 08/14/2021.    Interval history: [name] is a pleasant 41 year old premenopausal female with a thyroid disorder on T3 supplementation, chronic constipation who presents for follow-up care regarding iron deficiency anemia.  She reports she feels improved after she received 2 doses of Injectafer.  Fatigue and hair loss have improved.  Ice craving has resolved.  She did have some hair shedding last week.  She is following closely with her thyroid specialist.  She recently had iron studies checked and was told her ferritin was elevated.    Past Medical History:  Past Medical History:   Diagnosis Date    Vertigo         Past Surgical History:  Past Surgical History:   Procedure Laterality Date    BREAST REDUCTION SURGERY  07/2015    Dr Freddy Jaksch    CESAREAN SECTION  2015    DILATION AND CURETTAGE OF UTERUS  2014    HEMORRHOID SURGERY  07/2020    LASIK      TONSILLECTOMY  1985        Social History:  Social History       Tobacco History       Smoking Status  Never      Passive Exposure  Never      Smokeless Tobacco Use  Never              Alcohol History       Alcohol Use Status  Yes Drinks/Week  3 Glasses of wine per week Amount  3.0 standard drinks of alcohol/wk Comment  3-4 drinks per week              Drug Use       Drug Use Status  Never              Sexual Activity       Sexually Active  Not Asked                    Family History:  Family History   Problem Relation Age of Onset    Alcohol Abuse Mother     High Blood Pressure  Father     Brain Cancer Maternal Grandmother     Gout Maternal Grandfather     Stroke Maternal Grandfather     High Blood Pressure Maternal Grandfather     Heart Attack Maternal Grandfather     Prostate Cancer Paternal Grandfather     No Known Problems Son        Allergies:  Patient has no known allergies.    Medications:  Prior to Admission medications    Medication Sig Start Date End Date Taking? Authorizing Provider   albuterol sulfate HFA (VENTOLIN HFA) 108 (90 Base) MCG/ACT inhaler Inhale 2 puffs into the lungs every 6 hours as needed for Wheezing    Historical Provider, MD   Ciclopirox  1 % SHAM 1 APPLICATION EXTERNALLY AS NEEDED 30 DAYS 04/08/21   Fayrene Helper, MD   furosemide (LASIX) 20 MG tablet 1 tablet Orally One a day if needed for 90 days 04/08/21   Fayrene Helper, MD   Fexofenadine HCl (ALLEGRA ALLERGY PO) Take by mouth    Historical Provider, MD        Review of Systems:  A 14 point ROS was performed. Pertinent positives included in HPI, otherwise ROS negative.    Physical Exam:  BP 117/67   Pulse 63   Temp 98.7 F (37.1 C) (Oral)   Resp 16   Ht 5\' 5"  (1.651 m)   Wt 183 lb (83 kg)   SpO2 99%   BMI 30.45 kg/m     Deferred as visit was spent reviewing laboratory studies.    Laboratory Data:  Lab Results   Component Value Date    WBC 11.2 (H) 10/24/2021    HGB 12.9 10/24/2021    HCT 40.0 10/24/2021    MCV 90.7 10/24/2021    PLT 291 10/24/2021       Lab Results   Component Value Date    IRON 71 04/03/2020    TIBC 355 04/03/2020   Previous labs from 07/09/2021 with ferritin 8, serum iron 44, iron saturation 11%.    ASSESSMENT/PLAN:  Severe iron deficiency: Her hemoglobin is 12.9.  Ferritin was 8 in the past indicating severe iron deficiency.  She received 2 doses of Injectafer.  She is symptomatically improved with resolution of fatigue, ice craving, hair loss.  I have repeated iron studies today.  These were recently checked through endocrinology and she was told these were elevated.  She has  labs for thyroid every 3 to 4 months.  She will continue to follow with thyroid specialist and can be referred back here at any time if ferritin declines to the point where she requires IV iron replacement again.  Leukocytosis: This is neutrophil predominant.  This could be inflammation from other medical comorbidities.  No signs or symptoms of infection.  Continue with observation.  We can consider flow cytometry in the future if this becomes significantly elevated.  Of note, in 2020 this was up to 30,000 but declined thereafter.  Again that could possibly be related to infection in the past.    Return to clinic as needed.    2021, MD

## 2021-10-24 NOTE — Progress Notes (Signed)
Scripts verified   CBC, FEST  Last injectafer was 3/15 and 08/14/2021  Continue follow up with Janyth Pupa, NP  Patient reports she has frequent labwork  RTC PRN

## 2021-10-25 LAB — IRON AND TIBC
Iron Saturation: 44 % — ABNORMAL HIGH (ref 20–40)
Iron: 117 ug/dL (ref 37–145)
TIBC: 268 ug/dL (ref 250–450)
UIBC: 150.8 ug/dL (ref 112.0–347.0)

## 2021-10-25 LAB — RETICULOCYTES
RBC: 4.39 x10e6/mcL (ref 3.60–5.20)
Retic Ct Abs: 0.0707 /mcL (ref 0.0210–0.1080)
Retic Ct Pct: 1.6 % (ref 0.5–2.0)

## 2021-10-25 LAB — FOLATE: Folate: >20 ng/mL (ref 4.80–24.20)

## 2021-10-25 LAB — FERRITIN: Ferritin: 282.4 ng/mL — ABNORMAL HIGH (ref 13.0–150.0)

## 2021-10-25 LAB — VITAMIN B12: Vitamin B-12: 483 pg/mL (ref 232–1245)

## 2021-10-28 LAB — SOLUBLE TRANSFERRIN RECEPTOR: Soluble Transferrin Recept: 10.1 nmol/L — ABNORMAL LOW (ref 12.2–27.3)

## 2022-04-08 ENCOUNTER — Telehealth

## 2022-04-08 NOTE — Telephone Encounter (Signed)
Patient would like blood work ordered before physical appointment on 01/08  Please assist thank you

## 2022-06-02 ENCOUNTER — Encounter: Payer: BLUE CROSS/BLUE SHIELD | Attending: Family Medicine | Primary: Family Medicine

## 2022-06-23 ENCOUNTER — Encounter: Payer: BLUE CROSS/BLUE SHIELD | Attending: Family Medicine | Primary: Family Medicine

## 2022-06-30 ENCOUNTER — Encounter: Payer: BLUE CROSS/BLUE SHIELD | Attending: Family Medicine | Primary: Family Medicine

## 2022-07-09 ENCOUNTER — Encounter: Payer: BLUE CROSS/BLUE SHIELD | Attending: Family Medicine | Primary: Family Medicine

## 2022-07-15 ENCOUNTER — Encounter: Payer: BLUE CROSS/BLUE SHIELD | Attending: Family Medicine | Primary: Family Medicine

## 2022-08-06 LAB — COMPREHENSIVE METABOLIC PANEL
ALT: 15 U/L (ref 0–35)
AST: 21 U/L (ref 0–35)
Albumin/Globulin Ratio: 2.3 (ref 1.00–2.70)
Albumin: 4.5 g/dL (ref 3.5–5.2)
Alk Phosphatase: 63 U/L (ref 35–117)
Anion Gap: 9 mmol/L (ref 2–17)
BUN: 11 mg/dL (ref 6–20)
CALCIUM,CORRECTED,CCA: 9 mg/dL (ref 8.6–10.0)
CO2: 26 mmol/L (ref 22–29)
Calcium: 9.1 mg/dL (ref 8.6–10.0)
Chloride: 105 mmol/L (ref 98–107)
Creatinine: 0.8 mg/dL (ref 0.5–1.0)
Est, Glom Filt Rate: 95 mL/min/1.73m (ref >=60)
Globulin: 2 g/dL (ref 1.9–4.4)
Glucose: 92 mg/dL (ref 70–99)
OSMOLALITY CALCULATED: 278 mOsm/kg (ref 270–287)
Potassium: 5.1 mmol/L (ref 3.5–5.3)
Sodium: 140 mmol/L (ref 135–145)
Total Bilirubin: 0.19 mg/dL (ref 0.00–1.20)
Total Protein: 6.5 g/dL (ref 6.4–8.3)

## 2022-08-06 LAB — CBC WITH AUTO DIFFERENTIAL
Absolute Baso #: 0.1 10*3/uL (ref 0.0–0.2)
Absolute Eos #: 0.2 10*3/uL (ref 0.0–0.5)
Absolute Lymph #: 2.5 10*3/uL (ref 1.0–3.2)
Absolute Mono #: 0.5 10*3/uL (ref 0.3–1.0)
Basophils %: 0.6 % (ref 0.0–2.0)
Eosinophils %: 2.8 % (ref 0.0–7.0)
Hematocrit: 36.9 % (ref 34.0–47.0)
Hemoglobin: 11.8 g/dL (ref 11.5–15.7)
Immature Grans (Abs): 0.02 10*3/uL (ref 0.00–0.06)
Immature Granulocytes: 0.2 % (ref 0.0–0.6)
Lymphocytes: 30.2 % (ref 15.0–45.0)
MCH: 29.4 pg (ref 27.0–34.5)
MCHC: 32 g/dL (ref 32.0–36.0)
MCV: 92 fL (ref 81.0–99.0)
MPV: 10.3 fL (ref 7.2–13.2)
Monocytes: 6.4 % (ref 4.0–12.0)
NRBC Absolute: 0 10*3/uL (ref 0.000–0.012)
NRBC Automated: 0 % (ref 0.0–0.2)
Neutrophils %: 59.8 % (ref 42.0–74.0)
Neutrophils Absolute: 4.9 10*3/uL (ref 1.6–7.3)
Platelets: 322 10*3/uL (ref 140–440)
RBC: 4.01 x10e6/mcL (ref 3.60–5.20)
RDW: 12.8 % (ref 11.0–16.0)
WBC: 8.3 10*3/uL (ref 3.8–10.6)

## 2022-08-06 LAB — TSH WITH REFLEX: TSH: 1.49 mcIU/mL (ref 0.358–3.740)

## 2022-08-06 LAB — LIPID PANEL
Chol/HDL Ratio: 2.8 (ref 0.0–4.4)
Cholesterol: 184 mg/dL (ref 100–200)
HDL: 66 mg/dL (ref >=50)
LDL Cholesterol: 101 mg/dL — ABNORMAL HIGH (ref 0.0–100.0)
LDL/HDL Ratio: 1.5
Triglycerides: 87 mg/dL (ref 0–149)
VLDL: 17.4 mg/dL (ref 5.0–40.0)

## 2022-08-11 ENCOUNTER — Encounter

## 2022-08-11 ENCOUNTER — Encounter
Admit: 2022-08-11 | Discharge: 2022-08-11 | Payer: BLUE CROSS/BLUE SHIELD | Attending: Family Medicine | Primary: Family Medicine

## 2022-08-11 DIAGNOSIS — Z0000 Encounter for general adult medical examination without abnormal findings: Secondary | ICD-10-CM

## 2022-08-11 MED ORDER — CICLOPIROX 1 % EX SHAM
1 % | CUTANEOUS | 0 refills | Status: DC
Start: 2022-08-11 — End: 2023-09-01

## 2022-08-11 MED ORDER — HYDROXYZINE PAMOATE 25 MG PO CAPS
25 | ORAL_CAPSULE | Freq: Three times a day (TID) | ORAL | 1 refills | Status: AC | PRN
Start: 2022-08-11 — End: 2022-08-25

## 2022-08-11 NOTE — Progress Notes (Signed)
08/11/2022    Lauren [name] (DOB:  03-28-1981) is a 42 y.o. female, here for a preventive medicine evaluation. In the morning has a protein smoothie, 2 scoops of protein, fruits, green powder, bulletproof, Nutrafol, MV and 1 Vit D3, lunch salad with chicken or veggies, egg whites, 1/2 a bagel and carrots, dinner chicken and wild rice and mushrooms. For exercise dose [employer] Classes, weights, plays tennis once a week. No other concerns.    Patient Active Problem List   Diagnosis    Mixed hyperlipidemia    PMS (premenstrual syndrome)    Abnormal uterine bleeding (AUB)    Iron deficiency anemia    Reactive airway disease    Vertigo       Review of Systems    Prior to Visit Medications    Medication Sig Taking? Authorizing Provider   Ciclopirox 1 % SHAM 1 APPLICATION EXTERNALLY AS NEEDED 30 DAYS Yes Savian Mazon, Donnie Aho, MD   Fexofenadine HCl (ALLEGRA ALLERGY PO) Take by mouth Yes [provider]   furosemide (LASIX) 20 MG tablet 1 tablet Orally One a day if needed for 90 days  Patient not taking: Reported on 08/11/2022  Clarisa Fling, MD        No Known Allergies    Past Medical History:   Diagnosis Date    Hypothyroidism     Vertigo        Past Surgical History:   Procedure Laterality Date    BREAST REDUCTION SURGERY  07/2015    Dr Dorna Leitz    CESAREAN SECTION  2015    [place]  2014    HEMORRHOID SURGERY  07/2020    LASIK      TONSILLECTOMY  1985       Social History     Socioeconomic History    Marital status: Married     Spouse name: Not on file    Number of children: Not on file    Years of education: Not on file    Highest education level: Not on file   Occupational History    Not on file   Tobacco Use    Smoking status: Never     Passive exposure: Never    Smokeless tobacco: Never   Vaping Use    Vaping Use: Never used   Substance and Sexual Activity    Alcohol use: Yes     Alcohol/week: 3.0 standard drinks of alcohol     Types: 3 Glasses of wine per week     Comment: 3-4 drinks  per week    Drug use: Never    Sexual activity: Yes     Partners: Male   Other Topics Concern    Not on file   Social History Narrative    Not on file     Social Determinants of Health     Financial Resource Strain: Not on file   Food Insecurity: Not on file   Transportation Needs: Not on file   Physical Activity: Not on file   Stress: Not on file   Social Connections: Not on file   Intimate Partner Violence: Not on file   Housing Stability: Not on file        Family History   Problem Relation Age of Onset    Alcohol Abuse Mother     Arthritis Mother     High Blood Pressure Father     High Cholesterol Father     Brain Cancer Maternal Grandmother  Cancer Maternal Grandmother         Brain Tumor    Gout Maternal Grandfather     Stroke Maternal Grandfather     High Blood Pressure Maternal Grandfather     Heart Attack Maternal Grandfather     Prostate Cancer Paternal Grandfather     No Known Problems Son        ADVANCE DIRECTIVE: N, <no information>    Vitals:    08/11/22 1502   BP: (!) 100/56   Site: Left Upper Arm   Position: Sitting   Cuff Size: Medium Adult   Pulse: 69   SpO2: 98%   Weight: 84.4 kg (186 lb)   Height: 1.651 m (5\' 5" )     Estimated body mass index is 30.95 kg/m as calculated from the following:    Height as of this encounter: 1.651 m (5\' 5" ).    Weight as of this encounter: 84.4 kg (186 lb).    Physical Exam  Vitals reviewed.   Constitutional:       Appearance: Normal appearance.   HENT:      Head: Normocephalic and atraumatic.   Eyes:      Extraocular Movements: Extraocular movements intact.      Conjunctiva/sclera: Conjunctivae normal.   Cardiovascular:      Rate and Rhythm: Normal rate and regular rhythm.   Pulmonary:      Effort: Pulmonary effort is normal.   Musculoskeletal:         General: Normal range of motion.   Skin:     General: Skin is warm and dry.      Capillary Refill: Capillary refill takes less than 2 seconds.   Neurological:      Mental Status: She is alert and oriented to  person, place, and time. Mental status is at baseline.   Psychiatric:         Mood and Affect: Mood normal.         Thought Content: Thought content normal.             Latest Ref Rng & Units 08/06/2022     8:55 AM 10/24/2021    10:57 AM 04/09/2021     9:46 AM   LAB PRIMARY CARE   GLU random 70 - 99 mg/dL 92   87    CHOL 100 - 200 mg/dL 184   211    TRIG 0 - 149 mg/dL 87   71    HDL >=50 mg/dL 66   96    LDL CALC 0 - 99 mg/dL   102    NA 135 - 145 mmol/L 140   140    K 3.5 - 5.3 mmol/L 5.1   4.5    BUN 6 - 20 mg/dL 11   11    CR 0.5 - 1.0 mg/dL 0.8   0.87    CA 8.6 - 10.0 mg/dL 9.1   9.4    ALT 0 - 35 unit/L 15   16    AST 0 - 35 unit/L 21   27    TSH 0.358 - 3.740 mcIU/mL 1.490   2.560    HGB 11.5 - 15.7 g/dL 11.8  12.9  12.7        Lab Results   Component Value Date/Time    CHOL 184 08/06/2022 08:55 AM    CHOL 211 04/09/2021 09:46 AM    CHOL 202 04/03/2020 08:19 AM    TRIG 87 08/06/2022 08:55 AM  TRIG 71 04/09/2021 09:46 AM    TRIG 81 04/03/2020 08:19 AM    HDL 66 08/06/2022 08:55 AM    HDL 96 04/09/2021 09:46 AM    HDL 89 04/03/2020 08:19 AM    LDLCHOLESTEROL 101.0 08/06/2022 08:55 AM    LDLCHOLESTEROL 96.8 04/03/2020 08:19 AM    LDLCHOLESTEROL 102.2 03/18/2018 09:39 AM    LDLCALC 102 04/09/2021 09:46 AM    GLUCOSE 92 08/06/2022 08:55 AM    GLUCOSE 87 04/09/2021 09:46 AM       The 10-year ASCVD risk score (Arnett DK, et al., 2019) is: 0.2%    Values used to calculate the score:      Age: 36 years      Sex: Female      Is Non-Hispanic African American: No      Diabetic: No      Tobacco smoker: No      Systolic Blood Pressure: 123XX123 mmHg      Is BP treated: No      HDL Cholesterol: 66 mg/dL      Total Cholesterol: 184 mg/dL    Immunization History   Administered Date(s) Administered    INFLUENZA, INTRADERMAL, QUADRIVALENT, PRESERVATIVE FREE 03/21/2015    Influenza Virus Vaccine 03/29/2018    TDaP, ADACEL (age 65y-64y), BOOSTRIX (age 10y+), IM, 0.7mL 03/29/2018       Health Maintenance   Topic Date Due    Hepatitis B  vaccine (1 of 3 - 3-dose series) Never done    COVID-19 Vaccine (1) Never done    Varicella vaccine (1 of 2 - 2-dose childhood series) Never done    HIV screen  Never done    Hepatitis C screen  Never done    Flu vaccine (1) 12/24/2021    Depression Screen  03/07/2022    Cervical cancer screen  07/27/2023    Lipids  08/06/2027    DTaP/Tdap/Td vaccine (2 - Td or Tdap) 03/29/2028    Hepatitis A vaccine  Aged Out    Hib vaccine  Aged Out    HPV vaccine  Aged Out    Polio vaccine  Aged Out    Meningococcal (ACWY) vaccine  Aged Out    Pneumococcal 0-64 years Vaccine  Aged Out       Assessment & Plan   Routine general medical examination at a health care facility  Comments:  Discussed routine screening guidelines, age appropiate vaccines, accident avoidance, reviewed labs.  Iron deficiency anemia, unspecified iron deficiency anemia type  -     Vitamin B12; Future  -     Iron And TIBC; Future  -     Ferritin; Future  Insomnia, unspecified type  Comments:  Reports she wakes up at 3 am and has trouble falling back asleep, start Vistaril prn.    Return in about 1 year (around 08/11/2023).         --Clarisa Fling, MD

## 2022-08-11 NOTE — Progress Notes (Signed)
.Alcohol and Drug Use: consider all illegal drug, alcohol and prescription drug usage  Have you ever felt that you thought to cut down on your drinking and drug use? No  Have people annoyed you by criticizing your drinking or drug use? No  Have you ever felt bad or guilty about your drinking or drug use? No  Have you ever had a drink or used drugs first thing in the morning to steady your nerves or to get rid of a hangover?  No    Diet/Exercise:  None    How many days per week do you usually do moderate to strenuous physical activity, like a brisk walk?  4 days      General/Constitutional:   Fever:Denies   Fatigue: Denies   Lightheadedness: Denies   Night sweats: Denies   Weight change: Denies     ENT:  Nasal congestion: Denies   Sore throat: Denies   Swollen glands: Denies   Hoarseness: Denies   Visual changes: Admits  Sinus pain: Denies   Sneezing: Denies   Hay fever: Denies   Dental problems: Denies   Hearing changes: Denies     Respiratory:  Cough: Denies   Shortness of breath: Denies   Snoring: Denies   Wheezing: Denies   Sleep apnea: Denies     Cardiovascular:  Chest pain: Denies   Heart problems: Denies   Edema: Denies   Palpitations: Denies   Irregular heartbeat: Denies   Ankle swelling: Denies     Gastrointestinal:  Nausea: Denies   Constipation: Denies   Abdominal pain: Denies   Difficulty swallowing: Denies   Diarrhea: Denies   Heartburn: Denies   Change in bowel: Denies   Rectal bleeding: Denies     Genitourinary:   Frequent urination: Denies   Waking to urinate: Denies   Incontinence: Denies   Painful urination: Denies   Blood in urine: Denies   Dribbling after urination: Denies     Men Only:  Erectile dysfunction:Denies  Lack of sex drive: Denies    Women Only:  Irregular menses: Denies  Hot flashes: Denies  Painful periods: Admits  Menopause: Admits  Vaginal discharge: Denies  Pregnant: Denies  Painful sex: Denies  Heavy periods: Admits  Breast concerns: Denies  Preeclampsia: Denies    Skin:  Rash:  Denies   Acne: Denies   Hives: Denies   Itching: Denies   Non-healing sore: Denies   Change in mole: Denies     Musculoskeletal:   Back pain: Denies   Painful joints: Denies   Muscle aches: Denies   Neck pain: Denies   Joint stiffness: Denies     Endocrine:   Excessive thirst: Denies   Heat intolerance: Denies   Abnormal hair growth: Admits  Cold intolerance: Admits    Neurologic:   Weakness: Denies   Headaches: Denies   Tremors: Denies   Numbness: Denies   Frequent headaches: Denies   Memory loss: Denies     Psychiatric:  Panic attacks: Denies   Depression: Denies   Stress: Denies   Anxiety: Denies   Hallucinations: Denies   Difficulty sleeping: Denies     Misc:  Process own meats: Denies   Drinks well water: Denies   Feels safe at home: Denies   Feels safe with partner: Denies   Drinks unpasteurized milk: Denies   International travel: Denies   Occupational exposures: Denies   Dental visit: less than a year  Vision test: less than a year  Have sex with:  Women  Drinks greater than 3 servings of caffeine per day: Denies

## 2022-08-12 LAB — IRON AND TIBC
Iron % Saturation: 25 % (ref 20–40)
Iron: 69 ug/dL (ref 37–145)
TIBC: 274 ug/dL (ref 250–450)
UIBC: 205 ug/dL (ref 112.0–347.0)

## 2022-08-12 LAB — VITAMIN B12: Vitamin B-12: 521 pg/mL (ref 232–1245)

## 2022-08-12 LAB — FERRITIN: Ferritin: 168 ng/mL — ABNORMAL HIGH (ref 13.0–150.0)

## 2023-03-02 NOTE — Addendum Note (Signed)
Addended by: Milbert Coulter on: 03/02/2023 03:31 PM     Modules accepted: Orders

## 2023-04-10 ENCOUNTER — Inpatient Hospital Stay
Admit: 2023-04-10 | Disposition: A | Discharge status: Home / Self Care | Payer: BLUE CROSS/BLUE SHIELD | Source: Ambulatory Visit | Attending: Family Medicine | Admitting: Family Medicine | Primary: Family Medicine

## 2023-04-10 DIAGNOSIS — Z1231 Encounter for screening mammogram for malignant neoplasm of breast: Secondary | ICD-10-CM

## 2023-06-10 ENCOUNTER — Ambulatory Visit
Admit: 2023-06-10 | Discharge: 2023-06-10 | Payer: BLUE CROSS/BLUE SHIELD | Attending: Family Medicine | Primary: Family Medicine

## 2023-06-10 ENCOUNTER — Encounter

## 2023-06-10 VITALS — BP 100/58 | HR 74 | Ht 65.0 in | Wt 177.0 lb

## 2023-06-10 DIAGNOSIS — Z1231 Encounter for screening mammogram for malignant neoplasm of breast: Secondary | ICD-10-CM

## 2023-06-10 DIAGNOSIS — M2550 Pain in unspecified joint: Secondary | ICD-10-CM

## 2023-06-10 LAB — HEPATITIS B SURFACE ANTIBODY
Hep B S Ab Interp: IMMUNE — AB
Hep B S Ab: 72.8 (ref 11.5–999.0)

## 2023-06-10 LAB — C-REACTIVE PROTEIN: CRP: <0.3 mg/dL (ref 0.00–0.50)

## 2023-06-10 LAB — RHEUMATOID FACTOR: Rheumatoid Factor Quant: 22 [IU]/mL — ABNORMAL HIGH (ref 0.0–19.0)

## 2023-06-10 MED ORDER — HYDROXYZINE PAMOATE 25 MG PO CAPS
25 | ORAL_CAPSULE | Freq: Three times a day (TID) | ORAL | 4 refills | Status: AC | PRN
Start: 2023-06-10 — End: 2023-07-10

## 2023-06-10 NOTE — Progress Notes (Signed)
Lauren [name] (DOB:  [birth year]) is a 43 y.o. female,Established patient, here for evaluation of the following chief complaint(s):  Follow-up      Subjective   SUBJECTIVE/OBJECTIVE:  HPI Here for follow up. Is having some painful joints in her hands. Has been focusing on strength training and [employer]. No other concerns.    Review of Systems   Negative    Objective   BP (!) 100/58 (Site: Left Upper Arm, Position: Sitting)   Pulse 74   Ht 1.651 m (5\' 5" )   Wt 80.3 kg (177 lb)   SpO2 98%   BMI 29.45 kg/m   Physical Exam  Vitals reviewed.   Constitutional:       Appearance: Normal appearance.   HENT:      Head: Normocephalic and atraumatic.   Eyes:      Extraocular Movements: Extraocular movements intact.      Conjunctiva/sclera: Conjunctivae normal.   Cardiovascular:      Rate and Rhythm: Normal rate and regular rhythm.   Pulmonary:      Effort: Pulmonary effort is normal.   Musculoskeletal:         General: Normal range of motion.   Skin:     General: Skin is warm and dry.      Capillary Refill: Capillary refill takes less than 2 seconds.   Neurological:      Mental Status: She is alert and oriented to person, place, and time. Mental status is at baseline.   Psychiatric:         Mood and Affect: Mood normal.         Thought Content: Thought content normal.            ASSESSMENT/PLAN:  1. Arthralgia, unspecified joint  Comments:  Is having b/l joint pain, OTC analgesics as tolearted, labs pending.  Orders:  -     ANA Screen With Reflex; Future  -     Cyclic Citrul Peptide Antibody, IgG; Future  -     C-Reactive Protein; Future  -     Rheumatoid Factor; Future  2. Immunity status testing  -     Hepatitis B Surface Antibody; Future  -     Varicella Zoster Antibody, Igg; Future  3. Family history of heart disease  -     CT CARDIAC CALCIUM SCORING; Future  4. Encounter for screening mammogram for breast cancer  -     MAM TOMO DIGITAL SCREEN BILATERAL (PER PROTOCOL); Future         Return if symptoms worsen or fail  to improve.           An electronic signature was used to authenticate this note.    --Fayrene Helper, MD

## 2023-06-11 LAB — ANA SCREEN WITH REFLEX: ANA, Direct: NEGATIVE

## 2023-06-11 LAB — VARICELLA ZOSTER ANTIBODY, IGG: Varicella-Zoster Virus Ab, Igg: NONREACTIVE

## 2023-06-12 LAB — CYCLIC CITRUL PEPTIDE ANTIBODY, IGG: CCP Antibodies IgG/IgA: 4 U (ref 0–19)

## 2023-06-18 ENCOUNTER — Ambulatory Visit: Primary: Family Medicine

## 2023-06-22 ENCOUNTER — Encounter

## 2023-06-23 ENCOUNTER — Inpatient Hospital Stay: Admit: 2023-06-23 | Payer: BLUE CROSS/BLUE SHIELD | Attending: Family Medicine | Primary: Family Medicine

## 2023-06-23 DIAGNOSIS — Z8249 Family history of ischemic heart disease and other diseases of the circulatory system: Secondary | ICD-10-CM

## 2023-06-26 ENCOUNTER — Encounter: Admit: 2023-06-26 | Discharge: 2023-06-26 | Payer: BLUE CROSS/BLUE SHIELD | Primary: Family Medicine

## 2023-06-26 DIAGNOSIS — Z23 Encounter for immunization: Secondary | ICD-10-CM

## 2023-06-26 NOTE — Progress Notes (Signed)
IN CLINIC VACCINE ADMIN    Vaccine(s) Given Today: Varicella vaccine given per Bonita Quin Dr Timmothy Euler said okay to give.   After obtaining informed consent, the immunization is given by Alycia Rossetti. Told pt to follow up in month for her second dose.

## 2023-07-27 ENCOUNTER — Encounter: Admit: 2023-07-27 | Discharge: 2023-07-27 | Payer: BLUE CROSS/BLUE SHIELD | Primary: Family Medicine

## 2023-07-27 DIAGNOSIS — Z23 Encounter for immunization: Secondary | ICD-10-CM

## 2023-07-27 NOTE — Progress Notes (Signed)
 Administered Varicell vaccine dose 2, SQ to back of L upper arm. Pt tolerated injection well.

## 2023-08-12 ENCOUNTER — Encounter: Payer: BLUE CROSS/BLUE SHIELD | Attending: Family Medicine | Primary: Family Medicine

## 2023-08-16 NOTE — Progress Notes (Signed)
 This encounter was created in error - please disregard.

## 2023-08-17 ENCOUNTER — Encounter: Payer: BLUE CROSS/BLUE SHIELD | Attending: Family Medicine | Primary: Family Medicine

## 2023-08-20 NOTE — Progress Notes (Signed)
 This encounter was created in error - please disregard.

## 2023-09-02 ENCOUNTER — Ambulatory Visit
Admit: 2023-09-02 | Discharge: 2023-09-02 | Payer: BLUE CROSS/BLUE SHIELD | Attending: Family Medicine | Primary: Family Medicine

## 2023-09-02 VITALS — BP 100/58 | HR 71 | Ht 65.0 in | Wt 177.6 lb

## 2023-09-02 DIAGNOSIS — Z0000 Encounter for general adult medical examination without abnormal findings: Secondary | ICD-10-CM

## 2023-09-02 NOTE — Progress Notes (Signed)
 Lauren [name] (DOB:  10-31-1980) is a 43 y.o. female,Established patient, here for evaluation of the following chief complaint(s):  Annual Exam and Discuss Labs    Subjective   SUBJECTIVE/OBJECTIVE:  History of Present Illness  The patient presents for CPE.    Cholesterol levels have been actively managed through dietary modifications and increased physical activity, resulting in a 30-point reduction since the last visit. Steel-cut oats have been incorporated into the diet, and regular exercise, including weightlifting sessions with a personal trainer once a week, is reported. Pain in the hands and a noticeable decrease in grip strength during these sessions are noted.    A gynecologist appointment is scheduled for tomorrow. Elevated sex hormone binding globulin was noted on 06/10/2023.     Occasional lightheadedness is reported, attributed to a known condition of vertigo. No new medications have been started since the last visit. No abdominal pain or leg swelling is reported.       Review of Systems   Negative    Objective   BP (!) 100/58 (BP Site: Left Upper Arm, Patient Position: Sitting)   Pulse 71   Ht 1.651 m (5\' 5" )   Wt 80.6 kg (177 lb 9.6 oz)   SpO2 98%   BMI 29.55 kg/m   Physical Exam  Constitutional:       Appearance: She is normal weight.   HENT:      Head: Normocephalic and atraumatic.   Cardiovascular:      Rate and Rhythm: Normal rate and regular rhythm.   Pulmonary:      Effort: Pulmonary effort is normal.      Breath sounds: Normal breath sounds.   Neurological:      General: No focal deficit present.      Mental Status: She is alert. Mental status is at baseline.   Psychiatric:         Mood and Affect: Mood normal.         Behavior: Behavior normal.            ASSESSMENT/PLAN:  Assessment & Plan  1. CPE  Physical examination performed, vitals signs, BMI, lab test results reviewed, relevant lifestyle changes and risk factors modification discussed. Age appropriate vaccinations recommended.  Counseled patient regarding the benefits of healthy nutrition and exercise, including the relationship our diet has with our overall health including mental health, heart disease, diabetes and cancer.    2. Elevated cholesterol.  Her LDL cholesterol level is currently at 102, which is slightly above the optimal range of <100. Significant lifestyle changes, including dietary modifications and increased physical activity, have contributed to a 30-point reduction in her cholesterol levels compared to the previous year. Repeat ApoB levels are pending.    Continued dietary recommendations and exercise routine are advised.    3. Vertigo.  She experiences occasional lightheadedness, which she finds difficult to distinguish from her vertigo symptoms. No incidents have been reported recently.  Blood pressure is 100/58, which is on the lower end but not causing significant issues.  Counseling provided on recognizing symptoms and managing vertigo.  No new medications have been prescribed for vertigo at this time.         Encounter Diagnoses   Name Primary?    Routine general medical examination at a health care facility Yes    Elevated LDL cholesterol level       Results  Labs   - LDL cholesterol: 06/2023, 440 mg/dL   - Vitamin D level: 02/2724, 87 ng/mL   -  Thyroid hormones: 06/2023, Normal   - Cortisol: 06/2023, Normal   - Sex hormone binding globulin: 06/10/2023, Elevated     Return in about 1 year (around 09/01/2024).         The patient (or guardian, if applicable) and other individuals in attendance with the patient were advised that Artificial Intelligence will be utilized during this visit to record, process the conversation to generate a clinical note, and support improvement of the AI technology. The patient (or guardian, if applicable) and other individuals in attendance at the appointment consented to the use of AI, including the recording.         An electronic signature was used to authenticate this  note.    --Fayrene Helper, MD

## 2023-09-02 NOTE — Progress Notes (Deleted)
 09/02/2023    Lauren [name] (DOB:  [birth year]) is a 43 y.o. female, here for a preventive medicine evaluation.    Subjective   Patient Active Problem List   Diagnosis    Mixed hyperlipidemia    PMS (premenstrual syndrome)    Abnormal uterine bleeding (AUB)    Iron deficiency anemia    Reactive airway disease    Vertigo    Benign paroxysmal positional vertigo    Hypothyroidism       Review of Systems    Prior to Visit Medications    Medication Sig Taking? Authorizing Provider   hydrOXYzine pamoate (VISTARIL) 25 MG capsule Take 1 capsule by mouth 3 times daily as needed for Anxiety Yes [provider]   norgestimate-ethinyl estradiol (ORTHO-CYCLEN) 0.25-35 MG-MCG per tablet TAKE 1 TABLET BY MOUTH EVERY DAY AS DIRECTED START ON DAY 1 OF MENSES Yes [provider]        Allergies   Allergen Reactions    Pollen Extract Other (See Comments)       Past Medical History:   Diagnosis Date    Hypothyroidism     Vertigo        Past Surgical History:   Procedure Laterality Date    BREAST REDUCTION SURGERY  07/2015    Dr Freddy Jaksch    CESAREAN SECTION  2015    DILATION AND CURETTAGE OF UTERUS  2014    HEMORRHOID SURGERY  07/2020    LASIK      TONSILLECTOMY  1985       Social History     Socioeconomic History    Marital status: Married     Spouse name: Not on file    Number of children: Not on file    Years of education: Not on file    Highest education level: Not on file   Occupational History    Not on file   Tobacco Use    Smoking status: Never     Passive exposure: Never    Smokeless tobacco: Never   Vaping Use    Vaping status: Never Used   Substance and Sexual Activity    Alcohol use: Yes     Alcohol/week: 3.0 standard drinks of alcohol     Types: 3 Glasses of wine per week     Comment: 3-4 drinks per week    Drug use: Never    Sexual activity: Yes     Partners: Male   Other Topics Concern    Not on file   Social History Narrative    Not on file     Social Drivers of Health     Financial Resource Strain:  Not on file   Food Insecurity: Not on file   Transportation Needs: Not on file   Physical Activity: Not on file   Stress: Not on file   Social Connections: Not on file   Intimate Partner Violence: Not on file   Housing Stability: Not on file        Family History   Problem Relation Age of Onset    Alcohol Abuse Mother     Arthritis Mother     High Blood Pressure Father     High Cholesterol Father     Brain Cancer Maternal Grandmother     Cancer Maternal Grandmother         Brain Tumor    Gout Maternal Grandfather     Stroke Maternal Grandfather     High Blood Pressure Maternal Grandfather  Heart Attack Maternal Grandfather     Prostate Cancer Paternal Grandfather     No Known Problems Son        ADVANCE DIRECTIVE: N, <no information>    Vitals:    09/02/23 0935   BP: (!) 100/58   BP Site: Left Upper Arm   Patient Position: Sitting   Pulse: 71   SpO2: 98%   Weight: 80.6 kg (177 lb 9.6 oz)   Height: 1.651 m (5\' 5" )     Estimated body mass index is 29.55 kg/m as calculated from the following:    Height as of this encounter: 1.651 m (5\' 5" ).    Weight as of this encounter: 80.6 kg (177 lb 9.6 oz).       Objective   Physical Exam        Latest Ref Rng & Units 08/06/2022     8:55 AM 10/24/2021    10:57 AM 04/09/2021     9:46 AM   LAB [facility]   GLU random 70 - 99 mg/dL 92   87    CHOL 169 - 678 mg/dL 938   101    TRIG 0 - 751 mg/dL 87   71    HDL >=02 mg/dL 66   96    LDL CALC 0.0 - 100.0 mg/dL 585.2   778    NA 242 - 145 mmol/L 140   140    K 3.5 - 5.3 mmol/L 5.1   4.5    BUN 6 - 20 mg/dL 11   11    CR 0.5 - 1.0 mg/dL 0.8   3.53    GFR >=61 mL/min/1.45m 95   87    CA 8.6 - 10.0 mg/dL 9.1   9.4    ALT 0 - 35 unit/L 15   16    AST 0 - 35 unit/L 21   27    TSH 0.358 - 3.740 mcIU/mL 1.490   2.560    HGB 11.5 - 15.7 g/dL 44.3  15.4  00.8        Lab Results   Component Value Date/Time    CHOL 184 08/06/2022 08:55 AM    CHOL 211 04/09/2021 09:46 AM    CHOL 202 04/03/2020 08:19 AM    TRIG 87 08/06/2022 08:55 AM    TRIG 71  04/09/2021 09:46 AM    TRIG 81 04/03/2020 08:19 AM    HDL 66 08/06/2022 08:55 AM    HDL 96 04/09/2021 09:46 AM    HDL 89 04/03/2020 08:19 AM    GLUCOSE 92 08/06/2022 08:55 AM    GLUCOSE 87 04/09/2021 09:46 AM       The 10-year ASCVD risk score (Arnett DK, et al., 2019) is: 0.2%    Values used to calculate the score:      Age: 65 years      Sex: Female      Is Non-Hispanic African American: No      Diabetic: No      Tobacco smoker: No      Systolic Blood Pressure: 100 mmHg      Is BP treated: No      HDL Cholesterol: 66 mg/dL      Total Cholesterol: 184 mg/dL    Immunization History   Administered Date(s) Administered    COVID-19, MODERNA Booster BLUE border, (age 18y+), IM, 75mcg/0.25mL 08/12/2019, 09/02/2019, 04/23/2020    INFLUENZA, INTRADERMAL, QUADRIVALENT, PRESERVATIVE FREE 03/21/2015    Influenza Virus Vaccine 03/29/2018  Influenza, FLUARIX, FLULAVAL, FLUZONE (age 14 mo+) and AFLURIA, (age 76 y+), Quadv PF, 0.75mL 03/29/2018    TDaP, ADACEL (age 81y-64y), Leda Min (age 10y+), IM, 0.60mL 03/29/2018    Varicella, VARIVAX, (age 60m+), SC, 0.23mL 06/26/2023, 07/27/2023       Health Maintenance   Topic Date Due    HIV screen  Never done    Hepatitis C screen  Never done    Hepatitis B vaccine (1 of 3 - 19+ 3-dose series) Never done    COVID-19 Vaccine (4 - 2024-25 season) 01/25/2023    Cervical cancer screen  07/27/2023    Flu vaccine (Season Ended) 12/25/2023    Breast cancer screen  04/09/2024    Depression Screen  06/09/2024    Lipids  08/06/2027    DTaP/Tdap/Td vaccine (2 - Td or Tdap) 03/29/2028    Varicella vaccine  Completed    Hepatitis A vaccine  Aged Out    Hib vaccine  Aged Out    HPV vaccine  Aged Out    Polio vaccine  Aged Out    Meningococcal (ACWY) vaccine  Aged Out    Meningococcal B vaccine  Aged Out    Pneumococcal 0-49 years Vaccine  Aged Out           Assessment & [name]  Routine general medical examination at a health care facility   {A/P Summary:[phone]}           No follow-ups on file.            --Fayrene Helper, MD

## 2023-09-02 NOTE — Progress Notes (Signed)
 ..Alcohol and Drug Use: consider all illegal drug, alcohol and prescription drug usage  Have you ever felt that you thought to cut down on your drinking and drug use? No  Have people annoyed you by criticizing your drinking or drug use? No  Have you ever felt bad or guilty about your drinking or drug use? No  Have you ever had a drink or used drugs first thing in the morning to steady your nerves or to get rid of a hangover?  No    Diet/Exercise:  None    How many days per week do you usually do moderate to strenuous physical activity, like a brisk walk?  5 days      General/Constitutional:   Fever:Denies   Fatigue: Denies   Lightheadedness: Denies   Night sweats: Denies   Weight change: Denies     ENT:  Nasal congestion: Denies   Sore throat: Denies   Swollen glands: Denies   Hoarseness: Denies   Visual changes: Denies   Sinus pain: Denies   Sneezing: Denies   Hay fever: Denies   Dental problems: Denies   Hearing changes: Denies     Respiratory:  Cough: Denies   Shortness of breath: Denies   Snoring: Denies   Wheezing: Denies   Sleep apnea: Denies     Cardiovascular:  Chest pain: Denies   Heart problems: Denies   Edema: Denies   Palpitations: Denies   Irregular heartbeat: Denies   Ankle swelling: Denies     Gastrointestinal:  Nausea: Denies   Constipation: Denies   Abdominal pain: Denies   Difficulty swallowing: Denies   Diarrhea: Denies   Heartburn: Denies   Change in bowel: Denies   Rectal bleeding: Denies     Genitourinary:   Frequent urination: Denies   Waking to urinate: Denies   Incontinence: Denies   Painful urination: Denies   Blood in urine: Denies   Dribbling after urination: Denies     Men Only:  Erectile dysfunction:N/A  Lack of sex drive: N/A    Women Only:  Irregular menses: Denies  Hot flashes: Denies  Painful periods: Admits  Menopause: Denies  Vaginal discharge: Denies  Pregnant: Denies  Painful sex: Denies  Heavy periods: Denies  Breast concerns: Denies  Preeclampsia: Denies    Skin:  Rash: Denies    Acne: Denies   Hives: Denies   Itching: Denies   Non-healing sore: Denies   Change in mole: Denies     Musculoskeletal:   Back pain: Denies   Painful joints: Denies   Muscle aches: Denies   Neck pain: Denies   Joint stiffness: Denies     Endocrine:   Excessive thirst: Denies   Heat intolerance: Denies   Abnormal hair growth: Admits  Cold intolerance: Admits    Neurologic:   Weakness: Denies   Headaches: Denies   Tremors: Denies   Numbness: Denies   Frequent headaches: Denies   Memory loss: Denies     Psychiatric:  Panic attacks: Denies   Depression: Denies   Stress: Denies   Anxiety: Denies   Hallucinations: Denies   Difficulty sleeping: Denies     Misc:  Process own meats: Denies   Drinks well water: Denies   Feels safe at home: Admits  Feels safe with partner: Admits  Drinks unpasteurized milk: Denies   International travel: Human resources officer exposures: Denies   Dental visit: less than a year  Vision test: less than a year  Have sex with: Men  Drinks greater than 3 servings of caffeine per day: Denies

## 2024-02-03 ENCOUNTER — Encounter

## 2024-05-09 ENCOUNTER — Inpatient Hospital Stay: Admit: 2024-05-09 | Payer: BLUE CROSS/BLUE SHIELD | Attending: Family Medicine | Primary: Family Medicine

## 2024-05-09 DIAGNOSIS — Z1231 Encounter for screening mammogram for malignant neoplasm of breast: Secondary | ICD-10-CM
